# Patient Record
Sex: Male | Born: 1955
Health system: Southern US, Community
[De-identification: ages and names within clinical notes are randomized; demographics above are authoritative.]

## PROBLEM LIST (undated history)

## (undated) ENCOUNTER — Inpatient Hospital Stay: Admission: AD | Payer: Self-pay | Source: Other Acute Inpatient Hospital | Admitting: Interventional Cardiology

## (undated) DIAGNOSIS — C801 Malignant (primary) neoplasm, unspecified: Secondary | ICD-10-CM

## (undated) DIAGNOSIS — I10 Essential (primary) hypertension: Secondary | ICD-10-CM

## (undated) DIAGNOSIS — J45909 Unspecified asthma, uncomplicated: Secondary | ICD-10-CM

## (undated) DIAGNOSIS — J449 Chronic obstructive pulmonary disease, unspecified: Secondary | ICD-10-CM

## (undated) DIAGNOSIS — I219 Acute myocardial infarction, unspecified: Secondary | ICD-10-CM

## (undated) DIAGNOSIS — J302 Other seasonal allergic rhinitis: Secondary | ICD-10-CM

## (undated) DIAGNOSIS — E782 Mixed hyperlipidemia: Secondary | ICD-10-CM

## (undated) DIAGNOSIS — I2510 Atherosclerotic heart disease of native coronary artery without angina pectoris: Secondary | ICD-10-CM

## (undated) DIAGNOSIS — I480 Paroxysmal atrial fibrillation: Secondary | ICD-10-CM

## (undated) DIAGNOSIS — C4490 Unspecified malignant neoplasm of skin, unspecified: Secondary | ICD-10-CM

## (undated) HISTORY — PX: CORONARY ANGIOPLASTY: SHX604

## (undated) HISTORY — DX: Chronic obstructive pulmonary disease, unspecified: J44.9

## (undated) HISTORY — DX: Unspecified malignant neoplasm of skin, unspecified: C44.90

## (undated) HISTORY — DX: Atherosclerotic heart disease of native coronary artery without angina pectoris: I25.10

## (undated) HISTORY — DX: Mixed hyperlipidemia: E78.2

## (undated) HISTORY — DX: Acute myocardial infarction, unspecified: I21.9

## (undated) HISTORY — DX: Paroxysmal atrial fibrillation: I48.0

## (undated) HISTORY — PX: OTHER SURGICAL HISTORY: SHX169

## (undated) HISTORY — DX: Other seasonal allergic rhinitis: J30.2

---

## 2004-03-31 ENCOUNTER — Ambulatory Visit: Payer: Self-pay | Admitting: Cardiology

## 2004-04-03 ENCOUNTER — Ambulatory Visit: Payer: Self-pay | Admitting: Cardiology

## 2004-04-09 ENCOUNTER — Inpatient Hospital Stay (HOSPITAL_BASED_OUTPATIENT_CLINIC_OR_DEPARTMENT_OTHER): Admission: RE | Admit: 2004-04-09 | Discharge: 2004-04-09 | Payer: Self-pay | Admitting: Cardiology

## 2004-04-10 ENCOUNTER — Ambulatory Visit: Payer: Self-pay | Admitting: Cardiology

## 2004-04-16 ENCOUNTER — Observation Stay (HOSPITAL_COMMUNITY): Admission: RE | Admit: 2004-04-16 | Discharge: 2004-04-17 | Payer: Self-pay | Admitting: Cardiology

## 2004-04-16 ENCOUNTER — Ambulatory Visit: Payer: Self-pay | Admitting: Cardiology

## 2004-05-19 ENCOUNTER — Ambulatory Visit: Payer: Self-pay | Admitting: Cardiology

## 2004-07-14 ENCOUNTER — Ambulatory Visit: Payer: Self-pay | Admitting: Cardiology

## 2010-06-20 ENCOUNTER — Inpatient Hospital Stay (HOSPITAL_COMMUNITY)
Admission: EM | Admit: 2010-06-20 | Discharge: 2010-06-23 | DRG: 247 | Disposition: A | Discharge status: Home / Self Care | Payer: 59 | Source: Other Acute Inpatient Hospital | Attending: Cardiology | Admitting: Cardiology

## 2010-06-20 DIAGNOSIS — F17200 Nicotine dependence, unspecified, uncomplicated: Secondary | ICD-10-CM | POA: Diagnosis present

## 2010-06-20 DIAGNOSIS — J309 Allergic rhinitis, unspecified: Secondary | ICD-10-CM | POA: Diagnosis present

## 2010-06-20 DIAGNOSIS — I2589 Other forms of chronic ischemic heart disease: Secondary | ICD-10-CM | POA: Diagnosis present

## 2010-06-20 DIAGNOSIS — I2510 Atherosclerotic heart disease of native coronary artery without angina pectoris: Secondary | ICD-10-CM | POA: Diagnosis present

## 2010-06-20 DIAGNOSIS — Y840 Cardiac catheterization as the cause of abnormal reaction of the patient, or of later complication, without mention of misadventure at the time of the procedure: Secondary | ICD-10-CM | POA: Diagnosis present

## 2010-06-20 DIAGNOSIS — E785 Hyperlipidemia, unspecified: Secondary | ICD-10-CM | POA: Diagnosis present

## 2010-06-20 DIAGNOSIS — Z7982 Long term (current) use of aspirin: Secondary | ICD-10-CM

## 2010-06-20 DIAGNOSIS — Z7902 Long term (current) use of antithrombotics/antiplatelets: Secondary | ICD-10-CM

## 2010-06-20 DIAGNOSIS — Y92009 Unspecified place in unspecified non-institutional (private) residence as the place of occurrence of the external cause: Secondary | ICD-10-CM

## 2010-06-20 DIAGNOSIS — T82897A Other specified complication of cardiac prosthetic devices, implants and grafts, initial encounter: Secondary | ICD-10-CM | POA: Diagnosis present

## 2010-06-20 DIAGNOSIS — I2582 Chronic total occlusion of coronary artery: Secondary | ICD-10-CM | POA: Diagnosis present

## 2010-06-20 DIAGNOSIS — I214 Non-ST elevation (NSTEMI) myocardial infarction: Principal | ICD-10-CM | POA: Diagnosis present

## 2010-06-20 DIAGNOSIS — Z9861 Coronary angioplasty status: Secondary | ICD-10-CM

## 2010-06-20 LAB — BRAIN NATRIURETIC PEPTIDE: Pro B Natriuretic peptide (BNP): 73 pg/mL (ref 0.0–100.0)

## 2010-06-20 LAB — COMPREHENSIVE METABOLIC PANEL
ALT: 27 U/L (ref 0–53)
AST: 63 U/L — ABNORMAL HIGH (ref 0–37)
Albumin: 3.7 g/dL (ref 3.5–5.2)
CO2: 26 mEq/L (ref 19–32)
Chloride: 108 mEq/L (ref 96–112)
GFR calc Af Amer: >60 mL/min (ref >60)
GFR calc non Af Amer: >60 mL/min (ref >60)
Potassium: 3.8 mEq/L (ref 3.5–5.1)
Sodium: 140 mEq/L (ref 135–145)
Total Bilirubin: 0.6 mg/dL (ref 0.3–1.2)

## 2010-06-20 LAB — CBC
HCT: 44.8 % (ref 39.0–52.0)
Hemoglobin: 15.8 g/dL (ref 13.0–17.0)
MCV: 92.9 fL (ref 78.0–100.0)
RBC: 4.82 MIL/uL (ref 4.22–5.81)
RDW: 14 % (ref 11.5–15.5)
WBC: 19.5 10*3/uL — ABNORMAL HIGH (ref 4.0–10.5)

## 2010-06-20 LAB — DIFFERENTIAL
Basophils Absolute: 0 10*3/uL (ref 0.0–0.1)
Eosinophils Relative: 0 % (ref 0–5)
Lymphocytes Relative: 18 % (ref 12–46)
Lymphs Abs: 3.5 10*3/uL (ref 0.7–4.0)
Neutro Abs: 14.8 10*3/uL — ABNORMAL HIGH (ref 1.7–7.7)

## 2010-06-21 DIAGNOSIS — I214 Non-ST elevation (NSTEMI) myocardial infarction: Secondary | ICD-10-CM

## 2010-06-21 LAB — CARDIAC PANEL(CRET KIN+CKTOT+MB+TROPI)
CK, MB: 125.6 ng/mL (ref 0.3–4.0)
CK, MB: 105.4 ng/mL (ref 0.3–4.0)
Relative Index: 15.8 — ABNORMAL HIGH (ref 0.0–2.5)
Total CK: 794 U/L — ABNORMAL HIGH (ref 7–232)
Total CK: 780 U/L — ABNORMAL HIGH (ref 7–232)
Troponin I: 8.07 ng/mL (ref 0.00–0.06)

## 2010-06-21 LAB — CBC
HCT: 40.2 % (ref 39.0–52.0)
Hemoglobin: 13.8 g/dL (ref 13.0–17.0)
MCH: 32 pg (ref 26.0–34.0)
MCHC: 34.3 g/dL (ref 30.0–36.0)
RBC: 4.31 MIL/uL (ref 4.22–5.81)

## 2010-06-21 LAB — GLUCOSE, CAPILLARY
Glucose-Capillary: 114 mg/dL — ABNORMAL HIGH (ref 70–99)
Glucose-Capillary: 86 mg/dL (ref 70–99)

## 2010-06-21 LAB — HEPARIN LEVEL (UNFRACTIONATED): Heparin Unfractionated: 0.38 IU/mL (ref 0.30–0.70)

## 2010-06-22 DIAGNOSIS — I2510 Atherosclerotic heart disease of native coronary artery without angina pectoris: Secondary | ICD-10-CM

## 2010-06-22 LAB — GLUCOSE, CAPILLARY: Glucose-Capillary: 124 mg/dL — ABNORMAL HIGH (ref 70–99)

## 2010-06-22 LAB — POCT ACTIVATED CLOTTING TIME
Activated Clotting Time: 187 seconds
Activated Clotting Time: 523 seconds

## 2010-06-22 LAB — HEPARIN LEVEL (UNFRACTIONATED): Heparin Unfractionated: 0.36 IU/mL (ref 0.30–0.70)

## 2010-06-22 LAB — CBC
Hemoglobin: 14 g/dL (ref 13.0–17.0)
MCH: 31.8 pg (ref 26.0–34.0)
Platelets: 293 10*3/uL (ref 150–400)
RBC: 4.4 MIL/uL (ref 4.22–5.81)
WBC: 17.4 10*3/uL — ABNORMAL HIGH (ref 4.0–10.5)

## 2010-06-23 LAB — BASIC METABOLIC PANEL
BUN: 8 mg/dL (ref 6–23)
CO2: 28 mEq/L (ref 19–32)
Calcium: 8.2 mg/dL — ABNORMAL LOW (ref 8.4–10.5)
Creatinine, Ser: 0.75 mg/dL (ref 0.4–1.5)
GFR calc non Af Amer: >60 mL/min (ref >60)
Glucose, Bld: 102 mg/dL — ABNORMAL HIGH (ref 70–99)

## 2010-06-23 LAB — CBC
HCT: 39.7 % (ref 39.0–52.0)
Hemoglobin: 13.1 g/dL (ref 13.0–17.0)
MCH: 31 pg (ref 26.0–34.0)
MCHC: 33 g/dL (ref 30.0–36.0)
MCV: 94.1 fL (ref 78.0–100.0)
RDW: 14.2 % (ref 11.5–15.5)

## 2010-06-25 NOTE — Procedures (Signed)
[name], [name]              ACCOUNT NO.:  [id number]  MEDICAL RECORD NO.:  [id number]           PATIENT TYPE:  O  LOCATION:  6523                         FACILITY:  [facility]  PHYSICIAN:  Arturo Morton. Riley Kill, MD, FACCDATE OF BIRTH:  Aug 08, 1955  DATE OF PROCEDURE:  06/22/2010 DATE OF DISCHARGE:                           CARDIAC CATHETERIZATION   INDICATIONS:  [name] is a 55 year old who presents with chest pain. He has had prior stenting of the right coronary artery.  He has continued to smoke.  His enzymes were positive.  The current study was done to assess coronary anatomy.  Risks, benefits and alternatives were discussed with the patient.  He consented to proceed.  PROCEDURES: 1. Left heart catheterization. 2. Selective coronary arteriography. 3. Selective left ventriculography. 4. Stenting of the circumflex coronary artery using a drug-eluting     platform 2.75 x 16 Promus Element postdilated to 3.25.  DESCRIPTION OF PROCEDURE:  The patient was brought to the Catheterization Laboratory and prepped and draped in the usual fashion. We used the right radial approach.  The right radial artery was easily entered, a 5-French sheath was placed.  Diagnostic views of the right and left coronary arteries were obtained.  Central aortic and left ventricular pressures were obtained with a pigtail catheter. Ventriculography was performed in the RAO projection.  I carefully studied the old images and compared to the new.  The findings included a modestly plaqued left anterior descending artery without high-grade occlusion.  This collateralized the PDA.  The stent site was concluded on the right.  The circumflex has what appears to be a ruptured lesion in the mid-to-distal vessel.  There was modest plaque in the first marginal.  After discussion and consideration of the presentation, we elected to recommend stenting of the circumflex coronary artery.  I discussed this with the  patient and then informed his family.  The 5- French sheath in the radial artery was then upgraded to a 6-French sheath, a JL-3.5 guiding catheter was utilized.  We were able to take a traverse wire down across the lesion.  The vessel was directly stented using a 16 x 2.75 Promus Element drug-eluting stent, which was taken to 14 atmospheres.  Postdilatation was then done with a noncompliant 3.25- mm balloon.  This was taken up to at least 14 atmospheres.  There was marked improvement in the appearance of the artery.  Intracoronary nitroglycerin was administered.  The patient did have some catheter- induced spasm in the proximal LAD during the diagnostic shots, and we did a nonselective shot given the fact that the patient had separate ostia.  Using the guiding catheter, and the LAD appeared to be widely patent.  There were no major complications.  All catheters were subsequently removed and the TR band was placed.  For anticoagulation, heparin was utilized beginning of the radial case along with 3 mg of intra-arterial verapamil.  Heparin was given at 50 units per kg and ACT was then subsequently checked.  Bivalirudin was given according to protocol.  ACT was appropriate for percutaneous intervention.  I spoke with family after completion of the procedure.  HEMODYNAMIC  DATA: 1. Central aortic pressure 106/73, mean 88. 2. LV pressure 109/25. 3. There was no gradient or pullback across aortic valve.  ANGIOGRAPHIC DATA: 1. Ventriculography was done in the RAO projection, appears to be     inferobasal hypokinesis with EF of approximately 45%. 2. There are separate ostia for the LAD and circumflex. 3. The LAD has 30-40% segmental plaque at the proximal ostium, but it     does not appear to be high grade.  There are two major diagonal     branches.  The LAD proper courses to the apex were bifurcates.     There is diffuse plaquing at the apical tip.  There are extensive     collaterals to  the distal posterior descending branch, which fills     retrograde.  There was some catheter-induced spasm at the beginning     of the case, which was relieved with nitroglycerin as documented by     subselective shot on the circumflex vessel. 4. The left circumflex has a separate ostia.  There was a first     marginal branch with segmental ostial disease of 40-50%.  There is     a tiny second marginal, then a major second marginal or third     marginal formally which has a ruptured plaque in its midportion     that is hazy.  It is about 80% and that is at the site of the     previous area of disease.  Following stenting, this was reduced to     0% with residual luminal narrowing and excellent angiographic     result.  The remainder of the circumflex is without critical     disease. 5. The right coronary artery is patent up into the stent site, which     totally occluded.  As noted that the PDA fills retrograde from the     LAD system.  CONCLUSIONS: 1. Mild reduction in left ventricular function with an inferior wall     motion abnormality. 2. Probable chronic total occlusion of the right coronary artery with     excellent collaterals to the distal posterior descending artery     from the left anterior descending vessel. 3. New ruptured plaque in the midportion of the circumflex coronary     artery with successful stenting using a drug-eluting platform.  DISPOSITION:  The patient will be treated with continued aspirin and Plavix.  He received a Plavix load earlier today.  We will get P2Y12 inhibition testing.  Discontinuation of smoking will be highly recommended and a smoking cessation consult obtained.  Follow up will be with Dr. Diona Browner in [place].     Arturo Morton. Riley Kill, MD, [facility]     TDS/MEDQ  D:  06/22/2010  T:  06/23/2010  Job:  [id number]  cc:   CV Laboratory Edward Sidle, MD [name]. Edward Squibb, MD, [facility]  Electronically Signed by Shawnie Pons MD [facility] on 06/25/2010  05:42:01 AM

## 2010-06-26 ENCOUNTER — Telehealth: Payer: Self-pay | Admitting: Cardiology

## 2010-06-26 ENCOUNTER — Encounter: Payer: Self-pay | Admitting: *Deleted

## 2010-06-26 NOTE — Telephone Encounter (Signed)
This pt is actually followed by Dr Diona Browner for primary Cardiology.  Dr Riley Kill only performed cardiac cath.  The pt needs to get note from Dr Diona Browner in the [place] office.  I will route this message to that office for follow-up with the patient.

## 2010-06-26 NOTE — Telephone Encounter (Signed)
Wife called requesting return to work note for Monday since he was informed by Dr. Riley Kill he could return on Monday after stenting placed. Wife stated they forgot to ask Dr. Riley Kill for not after cath. Patient is scheduled ror f/u with [facility] on May 3rd. Please advise.

## 2010-06-26 NOTE — Telephone Encounter (Signed)
Pt wife states pt needs a note to return to work fax to #[phone]. Dr Riley Kill did his cath on Monday.

## 2010-06-26 NOTE — Telephone Encounter (Signed)
Actually it does not appear that I have seen this gentleman since 2006. Recent discharge summary was reviewed indicating non-ST elevation myocardial infarction and drug-eluting stent placement to the circumflex. There is no mention of when he is to return to work. Please determine what type of work he performs, to see if it i reasonable for him to make a return on Monday.[name]

## 2010-06-26 NOTE — Telephone Encounter (Signed)
Would agree with Dr. Riley Kill since recent infarct. I can see him at some point over the next few weeks to determine if he is ready to go back to work. I know that my schedule is full. Perhaps he could be seen by Gene on a day that I am in the office and can look at him as well.

## 2010-06-26 NOTE — Telephone Encounter (Signed)
Please advise on response from [facility] office.

## 2010-06-26 NOTE — Telephone Encounter (Signed)
I spoke with Dr Riley Kill and he said that this pt cannot return to work for 2 weeks following MI.  He felt that the pt should be evaluated by Dr Diona Browner in the office prior to May 3rd to discuss return to work.  I will forward this message back to the [place] office to follow-up with patient. 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

## 2010-06-29 NOTE — Telephone Encounter (Signed)
Patient's wife informed of the above.

## 2010-06-30 NOTE — Discharge Summary (Signed)
[name], [name]              ACCOUNT NO.:  [id number]  MEDICAL RECORD NO.:  [id number]           PATIENT TYPE:  O  LOCATION:  6523                         FACILITY:  [facility]  PHYSICIAN:  Madolyn Frieze. Jens Som, MD, FACCDATE OF BIRTH:  Mar 22, 1955  DATE OF ADMISSION:  06/20/2010 DATE OF DISCHARGE:  06/23/2010                              DISCHARGE SUMMARY   PRIMARY CARDIOLOGIST:  Jonelle Sidle, MD  DISCHARGE DIAGNOSIS:  Non-ST-segment elevation myocardial infarction.  SECONDARY DIAGNOSES: 1. Coronary artery disease status post prior right coronary artery     stenting in 2006 with left circumflex stenting this admission. 2. Hyperlipidemia. 3. Seasonal allergies. 4. History of nasal reconstruction surgery. 5. Ischemic cardiomyopathy with an ejection fraction of 45%. 6. Ongoing tobacco abuse.  ALLERGIES:  No known drug allergies.  PROCEDURES: 1. Left heart cardiac catheterization performed on June 22, 2010,     revealing total occlusion of previously placed right coronary     artery stents with left-to-right collaterals.  There was also     ruptured plaque in the left circumflex with 80% stenosis and this     was successfully stented with placement of a 2.75 x 16-mm Promus     Element Plus drug-eluting stent.  Left ventricular ejection     fraction was 45% with inferior hypokinesis.  HISTORY OF PRESENT ILLNESS:  This is a 55 year old male with prior history of coronary disease status post right coronary artery drug- eluting stent placement in 2006 who has continued to smoke cigarettes. He was in his usual state of health until June 19, 2010, when he noted right shoulder pain and general restlessness and inability to sleep that night.  On the day of admission, the patient had sudden onset of diaphoresis with nausea and lightheadedness while mowing his lawn.  He checked his blood pressure at home and found it to be 71/49.  At that point, he decided to present to the [facility] ED where he was treated with aspirin, heparin, and sublingual nitroglycerin with significant improvement in his shoulder discomfort.  ECG showed inferior Qs with 0.5- mm ST-segment elevation but no reciprocal ECG changes.  Cardiac enzymes showed a CK of 458 with an MB of 80.6, and troponin I of 2.4.  The patient was admitted for further evaluation and management of non-ST- segment elevation myocardial infarction.  HOSPITAL COURSE:  The patient eventually peaked a CK at 794, MB at 125.6, and troponin I at 8.07.  The patient was maintained on aspirin, beta-blocker, high-dose statin, and heparin therapy and subsequently loaded with oral Plavix.  The patient had no recurrence of chest discomfort over the weekend and on Monday, June 22, 2010, the patient was taken to the cardiac cath lab where diagnostic catheterization revealed total occlusion of previously placed right coronary artery with stent with left-to-right collaterals.  There was a new ruptured plaque in the mid left circumflex with 80% stenosis and this was felt to be the culprit lesion.  This was subsequently stented with placement of 2.75 x 16 mm Promus Element Plus drug-eluting stent.  The patient, otherwise, had nonobstructive disease in his proximal LAD  and proximal OM-1.  EF was 45% with inferior hypokinesis.  Postprocedure, the patient has been ambulating without recurrent symptoms or limitations.  He has been counseled on the importance of smoking cessation and has also been seen by Cardiac Rehab with outpatient referral made.  Of note, in the setting of ischemic cardiomyopathy, the patient has tolerated beta-blocker therapy; however, we have been unable to add ACE inhibitor or ARB secondary to borderline blood pressures in the mid 90s.  The patient will be discharged home today in good condition.  DISCHARGE LABORATORY DATA:  Hemoglobin 13.1, hematocrit 39.7, WBC 13.1, platelets 271, P2Y12 of 86 with 65% inhibition.   Sodium 141, potassium 3.6, chloride 107, CO2 of 28, BUN 8, creatinine 0.75, glucose 102, total bilirubin 0.6, alkaline phosphatase 61, AST 63, ALT 27, total protein 6.7, albumin 3.7, calcium 8.2.  CK 780, MB 105.4, troponin I 7.77.  MRSA screen was negative.  DISPOSITION:  The patient will be discharged home today in good condition.  FOLLOWUP PLANS AND APPOINTMENTS:  The patient is to follow up with Dr. Diona Browner as scheduled on Jul 16, 2010, at 9:20 a.m.  DISCHARGE MEDICATIONS: 1. Plavix 75 mg daily. 2. Lipitor 80 mg nightly. 3. Metoprolol 25 mg half a tablet b.i.d. 4. Nitroglycerin 0.4 mg sublingual p.r.n. chest pain. 5. Aspirin 81 mg daily. 6. Claritin 10 mg daily. 7. Fish oil over the counter 4 g daily. 8. Multivitamin 1 tablet daily.  OUTSTANDING LABORATORY STUDIES:  Followup lipids and LFTs in 6-8 weeks given new statin therapy.  DURATION OF DISCHARGE ENCOUNTER:  40 minutes including physician time.     Nicolasa Ducking, ANP   ______________________________ Madolyn Frieze. Jens Som, MD, [facility]    CB/MEDQ  D:  06/23/2010  T:  06/24/2010  Job:  [id number]  Electronically Signed by Nicolasa Ducking ANP on 06/29/2010 03:04:06 PM Electronically Signed by Olga Millers MD FACC on 06/30/2010 05:22:15 AM

## 2010-07-07 ENCOUNTER — Encounter: Payer: Self-pay | Admitting: *Deleted

## 2010-07-07 ENCOUNTER — Encounter: Payer: Self-pay | Admitting: Cardiology

## 2010-07-07 ENCOUNTER — Ambulatory Visit (INDEPENDENT_AMBULATORY_CARE_PROVIDER_SITE_OTHER): Payer: 59 | Admitting: Cardiology

## 2010-07-07 VITALS — BP 117/78 | HR 70 | Ht 71.0 in | Wt 221.0 lb

## 2010-07-07 DIAGNOSIS — I214 Non-ST elevation (NSTEMI) myocardial infarction: Secondary | ICD-10-CM

## 2010-07-07 DIAGNOSIS — I10 Essential (primary) hypertension: Secondary | ICD-10-CM

## 2010-07-07 DIAGNOSIS — E669 Obesity, unspecified: Secondary | ICD-10-CM

## 2010-07-07 DIAGNOSIS — I25119 Atherosclerotic heart disease of native coronary artery with unspecified angina pectoris: Secondary | ICD-10-CM | POA: Insufficient documentation

## 2010-07-07 DIAGNOSIS — I2510 Atherosclerotic heart disease of native coronary artery without angina pectoris: Secondary | ICD-10-CM

## 2010-07-07 DIAGNOSIS — Z79899 Other long term (current) drug therapy: Secondary | ICD-10-CM

## 2010-07-07 DIAGNOSIS — E782 Mixed hyperlipidemia: Secondary | ICD-10-CM

## 2010-07-07 DIAGNOSIS — F17200 Nicotine dependence, unspecified, uncomplicated: Secondary | ICD-10-CM

## 2010-07-07 NOTE — Assessment & Plan Note (Signed)
He is working on smoking cessation. Using nicotine gum. States that he did not have success with nicotine patches or Wellbutrin in the past.

## 2010-07-07 NOTE — Patient Instructions (Signed)
   Follow up in 2 months as scheduled. Your physician recommends that you continue on your current medications as directed. Please refer to the Current Medication list given to you today. You may return to work. Your physician recommends that you go to the [facility] for a FASTING lipid profile and liver function labs. Do not eat or drink after midnight. DO IN 2 MONTHS BEFORE OFFICE VISIT. TAKE THE LAB ORDER WITH YOU TO THE [facility].

## 2010-07-07 NOTE — Assessment & Plan Note (Signed)
Blood pressure well-controlled today. 

## 2010-07-07 NOTE — Assessment & Plan Note (Signed)
On high-dose Lipitor. Plan followup fasting lipid profile and liver function tests for his next visit.

## 2010-07-07 NOTE — Assessment & Plan Note (Signed)
We discussed diet and exercise for weight loss.

## 2010-07-07 NOTE — Progress Notes (Signed)
Clinical Summary [name] is a 55 y.o.male presenting to the office for a post hospital followup. I have not seen him since 2006. He recently presented with a non-ST elevation myocardial infarction and was transferred to [facility], undergoing cardiac catheterization on April 10 with Dr. Riley Kill. Results are outlined below. The patient underwent drug-eluting stent placement to the circumflex, otherwise managed medically.  Lab work review finds peak troponin I 8.0, BUN 8, creatinine 0.7, potassium 3.6, hemoglobin 13.1, platelets 271. No lipids obtained.  He is here with his wife today for followup. He reports doing relatively well. He is trying to make major changes including smoking cessation, diet, and also weight loss. He has not yet returned to work in his [place] to do so.  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  Reviewed his medications. He reports compliance, no bleeding problems on dual anti-platelet therapy. We also discussed the basic walking regimen. He states that his schedule will not allow cardiac rehabilitation.   No Known Allergies  Current outpatient prescriptions:aspirin 81 MG tablet, Take 81 mg by mouth daily.  , Disp: , Rfl: ;  atorvastatin (LIPITOR) 80 MG tablet, Take 80 mg by mouth daily.  , Disp: , Rfl: ;  clopidogrel (PLAVIX) 75 MG tablet, Take 75 mg by mouth daily.  , Disp: , Rfl: ;  loratadine (CLARITIN) 10 MG tablet, Take 10 mg by mouth daily.  , Disp: , Rfl: ;  metoprolol tartrate (LOPRESSOR) 25 MG tablet, Take 12.5 mg by mouth 2 (two) times daily.  , Disp: , Rfl:  Multiple Vitamin (MULTIVITAMIN) tablet, Take 1 tablet by mouth daily.  , Disp: , Rfl: ;  nitroGLYCERIN (NITROSTAT) 0.4 MG SL tablet, Place 0.4 mg under the tongue every 5 (five) minutes as needed.  , Disp: , Rfl: ;  Omega-3 Fatty Acids (FISH OIL) 1000 MG CAPS, Take 2 capsules by mouth 2 (two)  times daily.  , Disp: , Rfl:   Past Medical History  Diagnosis Date  . Coronary atherosclerosis of native coronary artery     DES RCA 2006, DES circumflex 4/12, occluded RCA with collaterals, LVEF 45%  . Myocardial infarction     NSTEMI 4/12  . Mixed hyperlipidemia   . Seasonal allergies     Social History [name] reports that he quit smoking 12 days ago. His smoking use included Cigarettes. He has a 45 pack-year smoking history. He has never used smokeless tobacco. [name] reports that he does not drink alcohol.  Review of Systems Otherwise reviewed and negative except as outlined.  Physical Examination Filed Vitals:   07/07/10 1506  BP: 117/78  Pulse: 70  Obese male in no acute distress. HEENT: Conjunctiva and lids normal, oropharynx clear. Neck: Supple, no elevated JVP or bruits, no thyromegaly. Lungs: Clear to auscultation, nontender. Cardiac: Regular rate and rhythm, no S3 or pericardial rub. Abdomen: Obese, nontender, bowel sounds present. Skin: Warm and dry, resolving ecchymosis in the right forearm. Extremities: No pitting edema, distal pulses full. Musculoskeletal: No kyphosis. Neuropsychiatric: Alert and oriented x3, affect appropriate.   Studies Cardiac catheterization 06/23/2010: ANGIOGRAPHIC DATA:   1. Ventriculography was done in the RAO projection, appears to be       inferobasal hypokinesis with EF of approximately 45%.   2. There are separate ostia for the LAD and circumflex.   3. The LAD has 30-40%  segmental plaque at the proximal ostium, but it       does not appear to be high grade.  There are two major diagonal       branches.  The LAD proper courses to the apex were bifurcates.       There is diffuse plaquing at the apical tip.  There are extensive       collaterals to the distal posterior descending branch, which fills       retrograde.  There was some catheter-induced spasm at the beginning       of the case, which was relieved with  nitroglycerin as documented by       subselective shot on the circumflex vessel.   4. The left circumflex has a separate ostia.  There was a first       marginal branch with segmental ostial disease of 40-50%.  There is       a tiny second marginal, then a major second marginal or third       marginal formally which has a ruptured plaque in its midportion       that is hazy.  It is about 80% and that is at the site of the       previous area of disease.  Following stenting, this was reduced to       0% with residual luminal narrowing and excellent angiographic       result.  The remainder of the circumflex is without critical       disease.   5. The right coronary artery is patent up into the stent site, which       totally occluded.  As noted that the PDA fills retrograde from the       LAD system.  Problem List and Plan

## 2010-07-07 NOTE — Assessment & Plan Note (Signed)
Clinically stable, over 2 weeks out from his initial event. Now status post drug-eluting stent placement as outlined, on dual antiplatelet therapy. Patient should be able to return to work with prior level of activity, essentially involving walking on a regular basis, no strenuous activity. He states that he will not be able to do formal cardiac rehabilitation. I did discuss the importance of diet, exercise, and weight loss, as well as his ongoing efforts at complete smoking cessation. Office followup to be arranged.

## 2010-07-07 NOTE — Assessment & Plan Note (Signed)
Anatomy outlined above. Presently stable symptomatically.

## 2010-07-16 ENCOUNTER — Encounter: Payer: 59 | Admitting: Cardiology

## 2010-07-16 NOTE — H&P (Signed)
NAMEGIANN, [name]              ACCOUNT NO.:  [id number]  MEDICAL RECORD NO.:  [id number]           PATIENT TYPE:  I  LOCATION:  2928                         FACILITY:  [facility]  PHYSICIAN:  Corky Crafts, MDDATE OF BIRTH:  October 25, 1955  DATE OF ADMISSION:  06/20/2010 DATE OF DISCHARGE:                             HISTORY & PHYSICAL   PRIMARY CARDIOLOGIST:  Jonelle Sidle, MD  REASON FOR ADMISSION:  Non-ST elevation MI.  HISTORY OF PRESENT ILLNESS:  The patient is a 55-year male with known coronary artery disease who continues to smoke.  He had drug-eluting stents placed in his right coronary artery in 2006.  He had been doing well since that time until yesterday.  He had shoulder pain, particularly in the right shoulder, he could not sleep.  This morning, he tried mowing the lawn and he broke out into a cold sweat and felt nauseated and lightheaded.  He checked his blood pressure at home and itwas 71/49.  He went to the emergency room and received nitroglycerin and aspirin along with heparin.  His shoulder tightness significantly decreased.  Currently, he feels much better.  He has chronic shoulder pain and he currently feels that he is not feeling the same symptoms he had earlier today when he went to the emergency room.  PAST MEDICAL HISTORY:  Coronary artery disease.  FAMILY HISTORY:  Significant for early coronary artery disease.  ALLERGIES:  No known drug allergies.  SOCIAL HISTORY:  Nasal reconstruction surgery.  SOCIAL HISTORY:  He does smoke.  He lives in [place].  MEDICATIONS: 1. Fish oil 4 grams daily. 2. Aspirin 325 mg daily. 3. Claritin 10 mg daily. 4. Multivitamin.  He has been off Plavix for several years.  REVIEW OF SYSTEMS:  Significant for shoulder and arm pain, nausea, sweating.  No bleeding problems.  No chest pain.  No focal weakness. All other systems negative.  PHYSICAL EXAMINATION:  VITAL SIGNS:  Blood pressure 102/73, heart  rate 18. GENERAL:  He is awake, alert, and in no apparent stress. HEAD:  Normocephalic and atraumatic. EYES:  Extraocular movements intact. NECK:  No JVD. CARDIOVASCULAR:  Regular rate and rhythm.  S1-S2. LUNGS:  Clear to auscultation bilaterally. ABDOMEN:  Soft, nontender, and nondistended. EXTREMITIES:  No edema, 2+ right radial pulse, 2+ pedal pulse bilaterally.  LABORATORY DATA:  At [facility] showed a CK of 458, MB of 80.6, troponin 2.4, and creatinine 1.02.  ECG showed normal sinus rhythm.  There were inferior Q-waves and some QRS complexes in the inferior leads.  There was 0.5 mm ST elevation but no reciprocal ECG changes were noted.  ASSESSMENT AND PLAN: 59. A 55 year old with non-ST-elevation myocardial infarction symptoms,     currently much improved, question whether this an issue with his     prior Cypher stent to the right coronary artery or perhaps a new     problem in his circumflex given the lack of ECG changes.  We will     have him on heparin IV, start low-dose beta blocker, and continue     aspirin. 2. He needs to stop smoking. 3. Given his  known coronary artery disease, we will start Crestor 10     mg daily. 4. He is agreeable to heart catheterization.  If his symptoms get     worse or his enzymes are very high, we would consider performing     the catheterization before Monday.     Corky Crafts, MD     JSV/MEDQ  D:  06/20/2010  T:  06/21/2010  Job:  [id number]  Electronically Signed by Lance Muss MD on 07/16/2010 01:30:59 PM

## 2010-09-08 ENCOUNTER — Ambulatory Visit (INDEPENDENT_AMBULATORY_CARE_PROVIDER_SITE_OTHER): Payer: 59 | Admitting: Cardiology

## 2010-09-08 ENCOUNTER — Encounter: Payer: Self-pay | Admitting: Cardiology

## 2010-09-08 VITALS — BP 114/71 | HR 63 | Ht 70.0 in | Wt 225.1 lb

## 2010-09-08 DIAGNOSIS — I10 Essential (primary) hypertension: Secondary | ICD-10-CM

## 2010-09-08 DIAGNOSIS — I2510 Atherosclerotic heart disease of native coronary artery without angina pectoris: Secondary | ICD-10-CM

## 2010-09-08 DIAGNOSIS — E782 Mixed hyperlipidemia: Secondary | ICD-10-CM

## 2010-09-08 DIAGNOSIS — F17200 Nicotine dependence, unspecified, uncomplicated: Secondary | ICD-10-CM

## 2010-09-08 NOTE — Assessment & Plan Note (Signed)
Blood pressure well-controlled today. 

## 2010-09-08 NOTE — Assessment & Plan Note (Signed)
Symptomatically stable. Continue medical therapy and observation. 

## 2010-09-08 NOTE — Progress Notes (Signed)
Clinical Summary Mr. Memmer is a 55 y.o.male presenting for followup. He was seen in the office back in April following a non-ST elevation myocardial infarction.  Followup lab work from June 5 showed AST 24, ALT 38, triglycerides 54, cholesterol 102, HDL 39, LDL 52. We discussed this today. He reports compliance with his medications.  He is back to work, no reported problems or limitations. Does some walking for exercise. States that he continues complete smoking cessation, has also been watching his diet. No unusual bleeding problems.  States that he has not seen Dr. Dimas Aguas for a routine physical in quite some time, and was encouraged to do so.  No Known Allergies  Current outpatient prescriptions:aspirin 81 MG tablet, Take 81 mg by mouth daily.  , Disp: , Rfl: ;  atorvastatin (LIPITOR) 80 MG tablet, Take 80 mg by mouth daily.  , Disp: , Rfl: ;  clopidogrel (PLAVIX) 75 MG tablet, Take 75 mg by mouth daily.  , Disp: , Rfl: ;  loratadine (CLARITIN) 10 MG tablet, Take 10 mg by mouth daily.  , Disp: , Rfl: ;  metoprolol tartrate (LOPRESSOR) 25 MG tablet, Take 12.5 mg by mouth 2 (two) times daily.  , Disp: , Rfl:  Multiple Vitamin (MULTIVITAMIN) tablet, Take 1 tablet by mouth daily.  , Disp: , Rfl: ;  Omega-3 Fatty Acids (FISH OIL) 1000 MG CAPS, Take 2 capsules by mouth 2 (two) times daily.  , Disp: , Rfl: ;  nitroGLYCERIN (NITROSTAT) 0.4 MG SL tablet, Place 0.4 mg under the tongue every 5 (five) minutes as needed.  , Disp: , Rfl:   Past Medical History  Diagnosis Date  . Coronary atherosclerosis of native coronary artery     DES RCA 2006, DES circumflex 4/12, occluded RCA with collaterals, LVEF 45%  . Myocardial infarction     NSTEMI 4/12  . Mixed hyperlipidemia   . Seasonal allergies     Social History Mr. Iannello reports that he quit smoking about 2 months ago. His smoking use included Cigarettes. He has a 45 pack-year smoking history. He has never used smokeless tobacco. Mr. Gladu reports  that he does not drink alcohol.  Review of Systems Negative except as outlined above.  Physical Examination Filed Vitals:   09/08/10 0930  BP: 114/71  Pulse: 63   Obese male in no acute distress.  HEENT: Conjunctiva and lids normal, oropharynx clear.  Neck: Supple, no elevated JVP or bruits, no thyromegaly.  Lungs: Clear to auscultation, nontender.  Cardiac: Regular rate and rhythm, no S3 or pericardial rub.  Abdomen: Obese, nontender, bowel sounds present.  Skin: Warm and dry, resolving ecchymosis in the right forearm.  Extremities: No pitting edema, distal pulses full.  Musculoskeletal: No kyphosis.  Neuropsychiatric: Alert and oriented x3, affect appropriate.    Problem List and Plan

## 2010-09-08 NOTE — Assessment & Plan Note (Signed)
Patient continues complete smoking cessation. He is using nicotine gum.

## 2010-09-08 NOTE — Patient Instructions (Signed)
Your physician wants you to follow-up in: 6 months. You will receive a reminder letter in the mail one-two months in advance. If you don't receive a letter, please call our office to schedule the follow-up appointment. Your physician recommends that you continue on your current medications as directed. Please refer to the Current Medication list given to you today. Your physician recommends that you go to the [facility] for a FASTING lipid profile and liver function labs. Do not eat or drink after midnight. DO IN 6 MONTHS BEFORE NEXT OFFICE VISIT. 

## 2010-09-08 NOTE — Assessment & Plan Note (Addendum)
Lipids are well controlled. Continue present regimen. Followup lipid profile and liver function tests for next visit.

## 2011-01-20 ENCOUNTER — Other Ambulatory Visit: Payer: Self-pay | Admitting: *Deleted

## 2011-01-20 ENCOUNTER — Other Ambulatory Visit: Payer: Self-pay | Admitting: Cardiovascular Disease

## 2011-01-20 DIAGNOSIS — Z79899 Other long term (current) drug therapy: Secondary | ICD-10-CM

## 2011-01-20 DIAGNOSIS — E782 Mixed hyperlipidemia: Secondary | ICD-10-CM

## 2011-03-17 ENCOUNTER — Ambulatory Visit: Payer: 59 | Admitting: Cardiology

## 2011-04-09 ENCOUNTER — Ambulatory Visit: Payer: 59 | Admitting: Cardiology

## 2011-08-20 ENCOUNTER — Other Ambulatory Visit: Payer: Self-pay | Admitting: Cardiovascular Disease

## 2011-10-06 ENCOUNTER — Ambulatory Visit: Payer: 59 | Admitting: Cardiology

## 2011-11-01 ENCOUNTER — Encounter: Payer: Self-pay | Admitting: Cardiology

## 2011-11-01 ENCOUNTER — Ambulatory Visit (INDEPENDENT_AMBULATORY_CARE_PROVIDER_SITE_OTHER): Payer: 59 | Admitting: Cardiology

## 2011-11-01 VITALS — BP 135/79 | HR 70 | Ht 71.0 in | Wt 212.1 lb

## 2011-11-01 DIAGNOSIS — E782 Mixed hyperlipidemia: Secondary | ICD-10-CM

## 2011-11-01 DIAGNOSIS — E785 Hyperlipidemia, unspecified: Secondary | ICD-10-CM

## 2011-11-01 DIAGNOSIS — I10 Essential (primary) hypertension: Secondary | ICD-10-CM

## 2011-11-01 DIAGNOSIS — Z79899 Other long term (current) drug therapy: Secondary | ICD-10-CM

## 2011-11-01 DIAGNOSIS — I2510 Atherosclerotic heart disease of native coronary artery without angina pectoris: Secondary | ICD-10-CM

## 2011-11-01 NOTE — Assessment & Plan Note (Signed)
No change to current medical regimen. 

## 2011-11-01 NOTE — Assessment & Plan Note (Signed)
Symptomatically stable on medical therapy. Followup ECG reviewed. Refills were provided for Lopressor, Plavix, and Lipitor. Continue observation. Recommended interval physical with Dr. Dimas Aguas.

## 2011-11-01 NOTE — Assessment & Plan Note (Signed)
Due for followup FLP and LFT. 

## 2011-11-01 NOTE — Progress Notes (Signed)
   Clinical Summary [name] is a 56 y.o.male presenting for followup. He was seen in June of 2012. He is here with his wife. Reports no significant angina or unusual shortness of breath. States that he has been compliant with his medications. He has not had any followup lab work. ECG shows sinus rhythm with old inferior infarct pattern. He needs refills on his cardiac medications.   No Known Allergies  Current Outpatient Prescriptions  Medication Sig Dispense Refill  . aspirin 81 MG tablet Take 81 mg by mouth daily.        [name] atorvastatin (LIPITOR) 80 MG tablet take 1 tablet by mouth at bedtime  30 tablet  6  . clopidogrel (PLAVIX) 75 MG tablet take 1 tablet by mouth once daily with food  30 tablet  6  . loratadine (CLARITIN) 10 MG tablet Take 10 mg by mouth daily.        . metoprolol tartrate (LOPRESSOR) 25 MG tablet take 1/2 tablet by mouth twice a day  30 tablet  6  . Multiple Vitamin (MULTIVITAMIN) tablet Take 1 tablet by mouth daily.        . Omega-3 Fatty Acids (FISH OIL) 1000 MG CAPS Take 2 capsules by mouth 2 (two) times daily.        . nitroGLYCERIN (NITROSTAT) 0.4 MG SL tablet Place 0.4 mg under the tongue every 5 (five) minutes as needed.          Past Medical History  Diagnosis Date  . Coronary atherosclerosis of native coronary artery     DES RCA 2006, DES circumflex 4/12, occluded RCA with collaterals, LVEF 45%  . Myocardial infarction     NSTEMI 4/12  . Mixed hyperlipidemia   . Seasonal allergies     Social History [name] reports that he has been smoking Cigarettes.  He has a 45 pack-year smoking history. He has never used smokeless tobacco. [name] reports that he does not drink alcohol.  Review of Systems No reported bleeding problems. No orthopnea or PND. No claudication.  Physical Examination Filed Vitals:   11/01/11 1041  BP: 135/79  Pulse: 70    Obese male in no acute distress.  HEENT: Conjunctiva and lids normal, oropharynx clear.  Neck:  Supple, no elevated JVP or bruits, no thyromegaly.  Lungs: Clear to auscultation, nontender.  Cardiac: Regular rate and rhythm, no S3 or pericardial rub.  Abdomen: Obese, nontender, bowel sounds present.  Skin: Warm and dry, resolving ecchymosis in the right forearm.  Extremities: No pitting edema, distal pulses full.  Musculoskeletal: No kyphosis.  Neuropsychiatric: Alert and oriented x3, affect appropriate.    Problem List and Plan   Coronary atherosclerosis of native coronary artery Symptomatically stable on medical therapy. Followup ECG reviewed. Refills were provided for Lopressor, Plavix, and Lipitor. Continue observation. Recommended interval physical with Dr. Dimas Aguas.  Essential hypertension, benign No change to current medical regimen.  Mixed hyperlipidemia Due for followup FLP and LFT.    Jonelle Sidle, M.D., F.A.C.C.

## 2011-11-01 NOTE — Patient Instructions (Addendum)
Your physician recommends that you schedule a follow-up appointment in: 1 year. You will receive a reminder letter in the mail in about 10 months reminding you to call and schedule your appointment. If you don't receive this letter, please contact our office.  Your physician recommends that you continue on your current medications as directed. Please refer to the Current Medication list given to you today.  Your physician recommends that you return for a FASTING lipid/liver profile: at [facility]. Please don't eat or drink after MIDNIGHT when you have this done.

## 2011-11-10 ENCOUNTER — Encounter: Payer: Self-pay | Admitting: *Deleted

## 2012-02-04 ENCOUNTER — Other Ambulatory Visit: Payer: Self-pay | Admitting: Cardiovascular Disease

## 2012-04-11 ENCOUNTER — Other Ambulatory Visit: Payer: Self-pay | Admitting: Cardiovascular Disease

## 2012-04-12 ENCOUNTER — Other Ambulatory Visit: Payer: Self-pay | Admitting: Cardiovascular Disease

## 2012-11-26 ENCOUNTER — Other Ambulatory Visit: Payer: Self-pay | Admitting: Cardiovascular Disease

## 2012-11-30 ENCOUNTER — Other Ambulatory Visit: Payer: Self-pay | Admitting: Cardiology

## 2012-12-07 ENCOUNTER — Encounter: Payer: 59 | Admitting: Cardiology

## 2012-12-07 ENCOUNTER — Encounter: Payer: Self-pay | Admitting: Cardiology

## 2012-12-07 NOTE — Progress Notes (Signed)
NNo-show. This encounter was created in error - please disregard. 

## 2013-01-31 ENCOUNTER — Encounter: Payer: Self-pay | Admitting: Cardiology

## 2013-01-31 HISTORY — PX: CYSTOSCOPY: SUR368

## 2013-04-06 ENCOUNTER — Other Ambulatory Visit: Payer: Self-pay | Admitting: Cardiology

## 2013-07-04 ENCOUNTER — Other Ambulatory Visit: Payer: Self-pay | Admitting: Cardiology

## 2013-07-04 NOTE — Telephone Encounter (Signed)
Number that is documented in pt chart is the wrong number for pt. Pt needs an appointment.

## 2013-07-12 ENCOUNTER — Other Ambulatory Visit: Payer: Self-pay | Admitting: Cardiology

## 2013-07-12 ENCOUNTER — Telehealth: Payer: Self-pay | Admitting: Cardiology

## 2013-07-12 NOTE — Telephone Encounter (Signed)
Pt is past due for fu appointment. attempted to call pt and get appt scheduled. LM for pt to return call.

## 2013-07-13 ENCOUNTER — Other Ambulatory Visit: Payer: Self-pay | Admitting: Cardiology

## 2013-07-31 ENCOUNTER — Other Ambulatory Visit: Payer: Self-pay | Admitting: *Deleted

## 2013-07-31 MED ORDER — ATORVASTATIN CALCIUM 80 MG PO TABS: 80.0000 mg | ORAL_TABLET | Freq: Every day | ORAL | Status: DC

## 2013-08-15 ENCOUNTER — Ambulatory Visit: Payer: 59 | Admitting: Cardiology

## 2013-08-23 ENCOUNTER — Other Ambulatory Visit: Payer: Self-pay | Admitting: *Deleted

## 2013-08-23 MED ORDER — ATORVASTATIN CALCIUM 80 MG PO TABS: 80.0000 mg | ORAL_TABLET | Freq: Every day | ORAL | Status: DC

## 2013-08-23 NOTE — Telephone Encounter (Signed)
Called wife to inform her that patient needed to keep his up coming appointment and refill for lipitor would be sent to his pharmacy.

## 2013-09-01 ENCOUNTER — Other Ambulatory Visit: Payer: Self-pay | Admitting: Cardiology

## 2013-09-11 ENCOUNTER — Other Ambulatory Visit: Payer: Self-pay | Admitting: Cardiology

## 2013-09-12 ENCOUNTER — Telehealth: Payer: Self-pay | Admitting: *Deleted

## 2013-09-12 ENCOUNTER — Other Ambulatory Visit: Payer: Self-pay | Admitting: Cardiology

## 2013-09-12 ENCOUNTER — Other Ambulatory Visit: Payer: Self-pay | Admitting: *Deleted

## 2013-09-12 DIAGNOSIS — Z79899 Other long term (current) drug therapy: Secondary | ICD-10-CM

## 2013-09-12 DIAGNOSIS — E782 Mixed hyperlipidemia: Secondary | ICD-10-CM

## 2013-09-12 MED ORDER — CLOPIDOGREL BISULFATE 75 MG PO TABS: 75.0000 mg | ORAL_TABLET | Freq: Every day | ORAL | Status: DC

## 2013-09-12 NOTE — Telephone Encounter (Signed)
Medication sent to pharmacy.  Pt will get refills at next appointment

## 2013-09-12 NOTE — Telephone Encounter (Signed)
Called and informed patient that fasting lab work was requested before this visit. Wife confirmed that patient hasn't done any lipid labs for PCP. Lab orders faxed to [facility] lab. Patient will have this done this week.

## 2013-09-24 ENCOUNTER — Encounter: Payer: Self-pay | Admitting: *Deleted

## 2013-09-25 ENCOUNTER — Encounter: Payer: Self-pay | Admitting: Cardiology

## 2013-09-25 ENCOUNTER — Ambulatory Visit (INDEPENDENT_AMBULATORY_CARE_PROVIDER_SITE_OTHER): Payer: 59 | Admitting: Cardiology

## 2013-09-25 VITALS — BP 128/86 | HR 60 | Ht 70.0 in | Wt 221.0 lb

## 2013-09-25 DIAGNOSIS — I10 Essential (primary) hypertension: Secondary | ICD-10-CM

## 2013-09-25 DIAGNOSIS — F17200 Nicotine dependence, unspecified, uncomplicated: Secondary | ICD-10-CM

## 2013-09-25 DIAGNOSIS — E782 Mixed hyperlipidemia: Secondary | ICD-10-CM

## 2013-09-25 DIAGNOSIS — I2510 Atherosclerotic heart disease of native coronary artery without angina pectoris: Secondary | ICD-10-CM

## 2013-09-25 NOTE — Assessment & Plan Note (Signed)
Blood pressure control is reasonable today. 

## 2013-09-25 NOTE — Progress Notes (Signed)
Clinical Summary [name] is a 58 y.o.male last seen in August 2013. He is here with his wife today. 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 He reports no angina symptoms or nitroglycerin use. ECG today is normal.  Recent lab work in July showed normal LFTs, triglycerides 31, cholesterol 102, HDL 38, LDL 58.  He is trying to quit smoking with his wife. We talked about different strategies including nicotine replacement.   No Known Allergies  Current Outpatient Prescriptions  Medication Sig Dispense Refill  . acetaminophen (TYLENOL) 500 MG tablet Take 500 mg by mouth as needed.      [name] atorvastatin (LIPITOR) 80 MG tablet Take 1 tablet (80 mg total) by mouth daily.  30 tablet  0  . clopidogrel (PLAVIX) 75 MG tablet Take 1 tablet (75 mg total) by mouth daily.  30 tablet  0  . guaiFENesin (MUCINEX) 600 MG 12 hr tablet Take 600 mg by mouth as needed.      . loratadine (CLARITIN) 10 MG tablet Take 10 mg by mouth daily.        . metoprolol tartrate (LOPRESSOR) 25 MG tablet take 1/2 tablet by mouth twice a day  15 tablet  0  . Multiple Vitamin (MULTIVITAMIN) tablet Take 1 tablet by mouth daily.        [name] NITROSTAT 0.4 MG SL tablet place 1 tablet under the tongue every 5 minutes for UP TO 3 doses if needed for angina as directed by prescriber  25 each  3  . Omega-3 Fatty Acids (FISH OIL) 1000 MG CAPS Take 2 capsules by mouth 2 (two) times daily.        . tamsulosin (FLOMAX) 0.4 MG CAPS capsule Take 0.4 mg by mouth daily.      [name] aspirin 81 MG tablet Take 81 mg by mouth daily.         No current facility-administered medications for this visit.    Past Medical History  Diagnosis Date  . Coronary atherosclerosis of native coronary artery     DES RCA 2006, DES circumflex 4/12, occluded RCA with collaterals, LVEF 45%  . Myocardial infarction     NSTEMI 4/12  . Mixed hyperlipidemia   . Seasonal allergies     Social History [name] reports that he has been smoking Cigarettes.  He has  a 45 pack-year smoking history. He has never used smokeless tobacco. [name] reports that he does not drink alcohol.  Review of Systems No palpitations or syncope. No claudication. Arthritic knee pain. No orthopnea or PND. Other systems reviewed and negative.  Physical Examination Filed Vitals:   09/25/13 1541  BP: 128/86  Pulse: 60   Filed Weights   09/25/13 1541  Weight: 221 lb (100.245 kg)    Obese male in no acute distress.  HEENT: Conjunctiva and lids normal, oropharynx clear.  Neck: Supple, no elevated JVP or bruits, no thyromegaly.  Lungs: Clear to auscultation, nontender.  Cardiac: Regular rate and rhythm, no S3 or pericardial rub.  Abdomen: Obese, nontender, bowel sounds present.  Skin: Warm and dry, resolving ecchymosis in the right forearm.  Extremities: No pitting edema, distal pulses full.  Musculoskeletal: No kyphosis.  Neuropsychiatric: Alert and oriented x3, affect appropriate.   Problem List and Plan   Coronary atherosclerosis of native coronary artery Symptomatically stable medical therapy, ECG is normal today. No changes were made in cardiac medical regimen. Continue annual followup, sooner if needed.  Essential hypertension, benign Blood pressure control is  reasonable today.  Mixed hyperlipidemia Recent LDL 58 on statin therapy.  Tobacco use disorder Smoking cessation strategies discussed.    Satira Sark, M.D., F.A.C.C.

## 2013-09-25 NOTE — Assessment & Plan Note (Signed)
Smoking cessation strategies discussed.

## 2013-09-25 NOTE — Assessment & Plan Note (Signed)
Symptomatically stable medical therapy, ECG is normal today. No changes were made in cardiac medical regimen. Continue annual followup, sooner if needed.

## 2013-09-25 NOTE — Assessment & Plan Note (Signed)
Recent LDL 58 on statin therapy.

## 2013-09-25 NOTE — Patient Instructions (Signed)
Your physician recommends that you schedule a follow-up appointment in: 1 year. You will receive a reminder letter in the mail in about 10 months reminding you to call and schedule your appointment. If you don't receive this letter, please contact our office. Your physician recommends that you continue on your current medications as directed. Please refer to the Current Medication list given to you today. Your physician recommends that you have FASTING lipid/liver profile in 1 year just before your next visit. Please fast at least 8 hours when you have this done.

## 2013-09-27 ENCOUNTER — Other Ambulatory Visit: Payer: Self-pay | Admitting: Cardiology

## 2013-09-27 ENCOUNTER — Other Ambulatory Visit: Payer: Self-pay | Admitting: *Deleted

## 2013-09-27 ENCOUNTER — Other Ambulatory Visit: Payer: Self-pay | Admitting: Cardiovascular Disease

## 2013-09-27 MED ORDER — NITROGLYCERIN 0.4 MG SL SUBL: SUBLINGUAL_TABLET | SUBLINGUAL | Status: DC

## 2013-09-27 MED ORDER — ATORVASTATIN CALCIUM 80 MG PO TABS: 80.0000 mg | ORAL_TABLET | Freq: Every day | ORAL | Status: DC

## 2013-09-27 MED ORDER — CLOPIDOGREL BISULFATE 75 MG PO TABS: 75.0000 mg | ORAL_TABLET | Freq: Every day | ORAL | Status: DC

## 2014-07-22 ENCOUNTER — Other Ambulatory Visit: Payer: Self-pay | Admitting: Cardiology

## 2014-07-22 ENCOUNTER — Encounter: Payer: Self-pay | Admitting: *Deleted

## 2014-07-22 ENCOUNTER — Ambulatory Visit (INDEPENDENT_AMBULATORY_CARE_PROVIDER_SITE_OTHER): Payer: 59 | Admitting: Cardiology

## 2014-07-22 ENCOUNTER — Encounter: Payer: Self-pay | Admitting: Cardiology

## 2014-07-22 ENCOUNTER — Telehealth: Payer: Self-pay | Admitting: Cardiology

## 2014-07-22 VITALS — BP 117/77 | HR 69 | Ht 70.0 in | Wt 226.1 lb

## 2014-07-22 DIAGNOSIS — I200 Unstable angina: Secondary | ICD-10-CM

## 2014-07-22 DIAGNOSIS — F17200 Nicotine dependence, unspecified, uncomplicated: Secondary | ICD-10-CM

## 2014-07-22 DIAGNOSIS — Z720 Tobacco use: Secondary | ICD-10-CM | POA: Diagnosis not present

## 2014-07-22 DIAGNOSIS — E782 Mixed hyperlipidemia: Secondary | ICD-10-CM | POA: Diagnosis not present

## 2014-07-22 DIAGNOSIS — R0602 Shortness of breath: Secondary | ICD-10-CM

## 2014-07-22 DIAGNOSIS — I25119 Atherosclerotic heart disease of native coronary artery with unspecified angina pectoris: Secondary | ICD-10-CM

## 2014-07-22 NOTE — Progress Notes (Signed)
Cardiology Office Note  Date: 07/22/2014   ID: AKSH SWART, DOB 1955/07/21, MRN [id number]  PCP: Rory Percy, MD  Primary Cardiologist: Rozann Lesches, MD   Chief Complaint  Patient presents with  . Coronary Artery Disease  . Hyperlipidemia  . Shortness of Breath    History of Present Illness: [name] is a 59 y.o. male last seen in July 2015. Evaluation by Dr. Nadara Mustard noted in late April. He has been describing shortness of breath and exertional chest discomfort over the last 6 months, COPD has been diagnosed, and he has also been placed on Spiriva in addition to his baseline pulmonary regimen. 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 Chest pain symptoms have both typical and atypical features, sometimes sharp, and sometimes more of a tightness. He does not notice any substantial improvement on his pulmonary inhalers. Reports that he is not using nitroglycerin. He feels the symptoms of done worse.  Last coronary intervention as outlined below, underwent placement of DES to the circumflex in the setting of NSTEMI back in April 2012. He had documentation of chronic total occlusion of the RCA with excellent collaterals at that point.  We reviewed his cardiac medications, he reports no changes. Recent testing including echocardiogram and chest x-ray are outlined below. Today we discussed options for follow-up cardiac evaluation including both noninvasive and invasive techniques. After discussing the risks and benefits, plan is to pursue a cardiac catheterization to most clearly reassess his coronary anatomy in light of progressing symptoms.   Past Medical History  Diagnosis Date  . Coronary atherosclerosis of native coronary artery     DES RCA 2006, DES circumflex 4/12, occluded RCA with collaterals, LVEF 45%  . Myocardial infarction     NSTEMI 4/12  . Mixed hyperlipidemia   .  Seasonal allergies   . COPD (chronic obstructive pulmonary disease)     Past Surgical History  Procedure Laterality Date  . Nasal reconstruction surgery    . Cystoscopy  01/31/2013    Current Outpatient Prescriptions  Medication Sig Dispense Refill  . acetaminophen (TYLENOL) 500 MG tablet Take 500 mg by mouth as needed.    [name] aspirin 81 MG tablet Take 81 mg by mouth daily.      [name] atorvastatin (LIPITOR) 80 MG tablet Take 1 tablet (80 mg total) by mouth daily. 30 tablet 11  . ATROVENT HFA 17 MCG/ACT inhaler Inhale 1 puff into the lungs every 6 (six) hours as needed.   0  . clopidogrel (PLAVIX) 75 MG tablet Take 1 tablet (75 mg total) by mouth daily. 30 tablet 11  . guaiFENesin (MUCINEX) 600 MG 12 hr tablet Take 600 mg by mouth as needed.    . loratadine (CLARITIN) 10 MG tablet Take 10 mg by mouth daily.      . metoprolol tartrate (LOPRESSOR) 25 MG tablet take 1/2 tablet by mouth twice a day 30 tablet 11  . Multiple Vitamin (MULTIVITAMIN) tablet Take 1 tablet by mouth daily.      . nitroGLYCERIN (NITROSTAT) 0.4 MG SL tablet place 1 tablet under the tongue every 5 minutes for UP TO 3 doses if needed for angina as directed by prescriber 25 tablet 3  . Omega-3 Fatty Acids (FISH OIL) 1000 MG CAPS Take 2 capsules by mouth 2 (two) times daily.      [name] QVAR 80 MCG/ACT inhaler Inhale 2 puffs into the lungs 2 (  two) times daily.   1  . SPIRIVA HANDIHALER 18 MCG inhalation capsule Place 18 mcg into inhaler and inhale daily.   1  . tamsulosin (FLOMAX) 0.4 MG CAPS capsule Take 0.4 mg by mouth daily.    . VENTOLIN HFA 108 (90 BASE) MCG/ACT inhaler Inhale 2 puffs into the lungs 3 (three) times daily.   1   No current facility-administered medications for this visit.    Allergies:  Review of patient's allergies indicates no known allergies.   Social History: The patient  reports that he has been smoking Cigarettes.  He has a 22.5 pack-year smoking history. He has never used smokeless tobacco. He reports  that he does not drink alcohol or use illicit drugs.   Family History: The patient's family history includes Coronary artery disease in an other family member.   ROS:  Please see the history of present illness. Otherwise, complete review of systems is positive for seasonal allergies.  All other systems are reviewed and negative.   Physical Exam: VS:  BP 117/77 mmHg  Pulse 69  Ht 5\' 10"  (1.778 m)  Wt 226 lb 1.9 oz (102.567 kg)  BMI 32.44 kg/m2  SpO2 95%, BMI Body mass index is 32.44 kg/(m^2).  Wt Readings from Last 3 Encounters:  07/22/14 226 lb 1.9 oz (102.567 kg)  09/25/13 221 lb (100.245 kg)  11/01/11 212 lb 1.9 oz (96.217 kg)     General: Patient appears comfortable at rest. HEENT: Conjunctiva and lids normal, oropharynx clear. Neck: Supple, no elevated JVP or carotid bruits, no thyromegaly. Lungs: Decreased breath sounds but clear, nonlabored breathing at rest. Cardiac: Regular rate and rhythm, no S3 or significant systolic murmur, no pericardial rub. Abdomen: Soft, nontender, bowel sounds present, no guarding or rebound. Extremities: No pitting edema, distal pulses 2+. Skin: Warm and dry. Musculoskeletal: No kyphosis. Neuropsychiatric: Alert and oriented x3, affect grossly appropriate.   ECG: ECG is not ordered today.   Recent Labwork:  09/20/2013: AST 21, ALT 32, triglycerides 31, cholesterol 102, HDL 38, LDL 58  Other Studies Reviewed Today:  Echocardiogram 05/20/2014 [facility]) reported mild LVH with LVEF 60-65%, abnormal diastolic function unspecified, trace aortic regurgitation, RVSP 25 mmHg.  Chest x-ray 06/05/2014 ([facility]) reported possible bronchitic changes without infiltrates.  Assessment and Plan:  1. Progressing exertional shortness of breath and possible accelerating angina symptoms over the last few months. Patient has diagnosis of COPD with tobacco abuse, but also CAD and some features more suggestive of angina. Recent echocardiogram shows LVEF 60-65%  with normal RVSP. Chest x-ray reporting bronchitic changes. Last coronary intervention was approximately 4 years ago with DES to the circumflex in the setting of NSTEMI. As outlined above, plan is to pursue a follow-up diagnostic cardiac catheterization to clearly evaluate the coronary anatomy and reassess for revascularization options in light of progressing symptoms.  2. CAD with chronic occlusion of the RCA following remote intervention associated with good collaterals as of 2012, also DES to the circumflex in 2012.  3. Tobacco abuse. Smoking cessation has been discussed.  4. Hyperlipidemia, on statin therapy. Lipid control is been good over time, last LDL 58.  5. Diagnosis of COPD per Dr. Nadara Mustard, likely also contributing to shortness of breath.  Current medicines were reviewed with the patient today.   Disposition: FU with me in 1 month.   Signed, Satira Sark, MD, [facility] 07/22/2014 8:52 AM    [facility] [place], [place],  [address] Phone: [phone];  Fax: [phone]

## 2014-07-22 NOTE — Telephone Encounter (Signed)
Left heart cath on Thursday 07/26/14 @10 :30 am with Dr. Martinique dx: CAD, accelerating angina, & SOB

## 2014-07-22 NOTE — Patient Instructions (Addendum)
Your physician recommends that you continue on your current medications as directed. Please refer to the Current Medication list given to you today. Your physician has requested that you have a cardiac catheterization. Cardiac catheterization is used to diagnose and/or treat various heart conditions. Doctors may recommend this procedure for a number of different reasons. The most common reason is to evaluate chest pain. Chest pain can be a symptom of coronary artery disease (CAD), and cardiac catheterization can show whether plaque is narrowing or blocking your heart's arteries. This procedure is also used to evaluate the valves, as well as measure the blood flow and oxygen levels in different parts of your heart. For further information please visit www.cardiosmart.org. Please follow instruction sheet, as given. Your physician recommends that you schedule a follow-up appointment in: 1 month. 

## 2014-07-22 NOTE — Telephone Encounter (Signed)
AUTH # [id number] EXP 09/05/14

## 2014-07-26 ENCOUNTER — Ambulatory Visit (HOSPITAL_COMMUNITY)
Admission: RE | Admit: 2014-07-26 | Discharge: 2014-07-26 | Disposition: A | Discharge status: Home / Self Care | Payer: 59 | Source: Ambulatory Visit | Attending: Cardiovascular Disease | Admitting: Cardiovascular Disease

## 2014-07-26 ENCOUNTER — Encounter (HOSPITAL_COMMUNITY)
Admission: RE | Disposition: A | Discharge status: Home / Self Care | Payer: 59 | Source: Ambulatory Visit | Attending: Cardiovascular Disease

## 2014-07-26 ENCOUNTER — Encounter (HOSPITAL_COMMUNITY): Payer: Self-pay | Admitting: Cardiology

## 2014-07-26 DIAGNOSIS — E782 Mixed hyperlipidemia: Secondary | ICD-10-CM | POA: Insufficient documentation

## 2014-07-26 DIAGNOSIS — I200 Unstable angina: Secondary | ICD-10-CM

## 2014-07-26 DIAGNOSIS — F17200 Nicotine dependence, unspecified, uncomplicated: Secondary | ICD-10-CM | POA: Diagnosis present

## 2014-07-26 DIAGNOSIS — I252 Old myocardial infarction: Secondary | ICD-10-CM | POA: Diagnosis not present

## 2014-07-26 DIAGNOSIS — F17210 Nicotine dependence, cigarettes, uncomplicated: Secondary | ICD-10-CM | POA: Diagnosis not present

## 2014-07-26 DIAGNOSIS — I10 Essential (primary) hypertension: Secondary | ICD-10-CM | POA: Diagnosis present

## 2014-07-26 DIAGNOSIS — Z7982 Long term (current) use of aspirin: Secondary | ICD-10-CM | POA: Diagnosis not present

## 2014-07-26 DIAGNOSIS — Z955 Presence of coronary angioplasty implant and graft: Secondary | ICD-10-CM | POA: Diagnosis not present

## 2014-07-26 DIAGNOSIS — Z7902 Long term (current) use of antithrombotics/antiplatelets: Secondary | ICD-10-CM | POA: Diagnosis not present

## 2014-07-26 DIAGNOSIS — I2510 Atherosclerotic heart disease of native coronary artery without angina pectoris: Secondary | ICD-10-CM | POA: Insufficient documentation

## 2014-07-26 DIAGNOSIS — J449 Chronic obstructive pulmonary disease, unspecified: Secondary | ICD-10-CM | POA: Diagnosis not present

## 2014-07-26 DIAGNOSIS — I25119 Atherosclerotic heart disease of native coronary artery with unspecified angina pectoris: Secondary | ICD-10-CM | POA: Diagnosis present

## 2014-07-26 DIAGNOSIS — I209 Angina pectoris, unspecified: Secondary | ICD-10-CM

## 2014-07-26 DIAGNOSIS — R0609 Other forms of dyspnea: Secondary | ICD-10-CM | POA: Diagnosis present

## 2014-07-26 HISTORY — PX: CARDIAC CATHETERIZATION: SHX172

## 2014-07-26 LAB — CBC
HEMATOCRIT: 47.7 % (ref 39.0–52.0)
Hemoglobin: 16.7 g/dL (ref 13.0–17.0)
MCH: 32.6 pg (ref 26.0–34.0)
MCHC: 35 g/dL (ref 30.0–36.0)
MCV: 93 fL (ref 78.0–100.0)
Platelets: 288 10*3/uL (ref 150–400)
RBC: 5.13 MIL/uL (ref 4.22–5.81)
RDW: 13.8 % (ref 11.5–15.5)
WBC: 15.1 10*3/uL — ABNORMAL HIGH (ref 4.0–10.5)

## 2014-07-26 LAB — BASIC METABOLIC PANEL
ANION GAP: 7 (ref 5–15)
BUN: 12 mg/dL (ref 6–20)
CO2: 25 mmol/L (ref 22–32)
Calcium: 8.9 mg/dL (ref 8.9–10.3)
Chloride: 107 mmol/L (ref 101–111)
Creatinine, Ser: 0.76 mg/dL (ref 0.61–1.24)
GFR calc Af Amer: >60 mL/min (ref >60)
Glucose, Bld: 111 mg/dL — ABNORMAL HIGH (ref 65–99)
POTASSIUM: 3.9 mmol/L (ref 3.5–5.1)
Sodium: 139 mmol/L (ref 135–145)

## 2014-07-26 LAB — PROTIME-INR
INR: 1.02 (ref 0.00–1.49)
Prothrombin Time: 13.5 seconds (ref 11.6–15.2)

## 2014-07-26 SURGERY — LEFT HEART CATH AND CORONARY ANGIOGRAPHY
Anesthesia: LOCAL

## 2014-07-26 MED ORDER — LIDOCAINE HCL (PF) 1 % IJ SOLN
INTRAMUSCULAR | Status: AC
  Filled 2014-07-26: qty 30

## 2014-07-26 MED ORDER — HEPARIN SODIUM (PORCINE) 1000 UNIT/ML IJ SOLN
INTRAMUSCULAR | Status: AC
  Filled 2014-07-26: qty 1

## 2014-07-26 MED ORDER — SODIUM CHLORIDE 0.9 % IV SOLN
INTRAVENOUS | Status: DC
  Administered 2014-07-26: 09:00:00 via INTRAVENOUS

## 2014-07-26 MED ORDER — HEPARIN SODIUM (PORCINE) 1000 UNIT/ML IJ SOLN
INTRAMUSCULAR | Status: DC | PRN
  Administered 2014-07-26: 5000 [IU] via INTRAVENOUS

## 2014-07-26 MED ORDER — MIDAZOLAM HCL 2 MG/2ML IJ SOLN
INTRAMUSCULAR | Status: AC
  Filled 2014-07-26: qty 2

## 2014-07-26 MED ORDER — FENTANYL CITRATE (PF) 100 MCG/2ML IJ SOLN
INTRAMUSCULAR | Status: DC | PRN
  Administered 2014-07-26: 25 ug via INTRAVENOUS

## 2014-07-26 MED ORDER — SODIUM CHLORIDE 0.9 % IJ SOLN: 3.0000 mL | Freq: Two times a day (BID) | INTRAMUSCULAR | Status: DC

## 2014-07-26 MED ORDER — VERAPAMIL HCL 2.5 MG/ML IV SOLN
INTRAVENOUS | Status: DC | PRN
  Administered 2014-07-26: 13:00:00 via INTRA_ARTERIAL

## 2014-07-26 MED ORDER — SODIUM CHLORIDE 0.9 % IJ SOLN: 3.0000 mL | INTRAMUSCULAR | Status: DC | PRN

## 2014-07-26 MED ORDER — SODIUM CHLORIDE 0.9 % WEIGHT BASED INFUSION: 3.0000 mL/kg/h | INTRAVENOUS | Status: DC

## 2014-07-26 MED ORDER — IOHEXOL 350 MG/ML SOLN
INTRAVENOUS | Status: DC | PRN
  Administered 2014-07-26: 75 mL via INTRAVENOUS

## 2014-07-26 MED ORDER — HEPARIN (PORCINE) IN NACL 2-0.9 UNIT/ML-% IJ SOLN
INTRAMUSCULAR | Status: AC
  Filled 2014-07-26: qty 1000

## 2014-07-26 MED ORDER — SODIUM CHLORIDE 0.9 % IV SOLN: 250.0000 mL | INTRAVENOUS | Status: DC | PRN

## 2014-07-26 MED ORDER — MIDAZOLAM HCL 2 MG/2ML IJ SOLN
INTRAMUSCULAR | Status: DC | PRN
  Administered 2014-07-26: 1 mg via INTRAVENOUS

## 2014-07-26 MED ORDER — VERAPAMIL HCL 2.5 MG/ML IV SOLN
INTRAVENOUS | Status: AC
  Filled 2014-07-26: qty 2

## 2014-07-26 MED ORDER — FENTANYL CITRATE (PF) 100 MCG/2ML IJ SOLN
INTRAMUSCULAR | Status: AC
  Filled 2014-07-26: qty 2

## 2014-07-26 SURGICAL SUPPLY — 15 items
CATH INFINITI 5 FR JL3.5 (CATHETERS) ×2 IMPLANT
CATH INFINITI 5FR ANG PIGTAIL (CATHETERS) ×2 IMPLANT
CATH INFINITI 5FR MULTPACK ANG (CATHETERS) IMPLANT
CATH INFINITI JR4 5F (CATHETERS) ×2 IMPLANT
DEVICE RAD COMP TR BAND LRG (VASCULAR PRODUCTS) ×2 IMPLANT
GLIDESHEATH SLEND SS 6F .021 (SHEATH) ×2 IMPLANT
KIT HEART LEFT (KITS) ×2 IMPLANT
PACK CARDIAC CATHETERIZATION (CUSTOM PROCEDURE TRAY) ×2 IMPLANT
SHEATH PINNACLE 5F 10CM (SHEATH) IMPLANT
SYR MEDRAD MARK V 150ML (SYRINGE) ×2 IMPLANT
TRANSDUCER W/STOPCOCK (MISCELLANEOUS) ×2 IMPLANT
TUBING CIL FLEX 10 FLL-RA (TUBING) ×2 IMPLANT
WIRE EMERALD 3MM-J .035X150CM (WIRE) IMPLANT
WIRE HITORQ VERSACORE ST 145CM (WIRE) ×2 IMPLANT
WIRE SAFE-T 1.5MM-J .035X260CM (WIRE) ×2 IMPLANT

## 2014-07-26 NOTE — Discharge Instructions (Signed)
Radial Site Care °Refer to this sheet in the next few weeks. These instructions provide you with information on caring for yourself after your procedure. Your caregiver may also give you more specific instructions. Your treatment has been planned according to current medical practices, but problems sometimes occur. Call your caregiver if you have any problems or questions after your procedure. °HOME CARE INSTRUCTIONS °· You may shower the day after the procedure. Remove the bandage (dressing) and gently wash the site with plain soap and water. Gently pat the site dry. °· Do not apply powder or lotion to the site. °· Do not submerge the affected site in water for 3 to 5 days. °· Inspect the site at least twice daily. °· Do not flex or bend the affected arm for 24 hours. °· No lifting over 5 pounds (2.3 kg) for 5 days after your procedure. °· Do not drive home if you are discharged the same day of the procedure. Have someone else drive you. °· You may drive 24 hours after the procedure unless otherwise instructed by your caregiver. °· Do not operate machinery or power tools for 24 hours. °· A responsible adult should be with you for the first 24 hours after you arrive home. °What to expect: °· Any bruising will usually fade within 1 to 2 weeks. °· Blood that collects in the tissue (hematoma) may be painful to the touch. It should usually decrease in size and tenderness within 1 to 2 weeks. °SEEK IMMEDIATE MEDICAL CARE IF: °· You have unusual pain at the radial site. °· You have redness, warmth, swelling, or pain at the radial site. °· You have drainage (other than a small amount of blood on the dressing). °· You have chills. °· You have a fever or persistent symptoms for more than 72 hours. °· You have a fever and your symptoms suddenly get worse. °· Your arm becomes pale, cool, tingly, or numb. °· You have heavy bleeding from the site. Hold pressure on the site. °Document Released: 04/03/2010 Document Revised:  05/24/2011 Document Reviewed: 04/03/2010 °Exit[employer]® Patient Information ©2015 [employer]. This information is not intended to replace advice given to you by your health care provider. Make sure you discuss any questions you have with your health care provider. ° °

## 2014-07-26 NOTE — H&P (View-Only) (Signed)
Cardiology Office Note  Date: 07/22/2014   ID: Edward FEDERICI, DOB Jul 08, 1955, MRN [id number]  PCP: Rory Percy, MD  Primary Cardiologist: Rozann Lesches, MD   Chief Complaint  Patient presents with  . Coronary Artery Disease  . Hyperlipidemia  . Shortness of Breath    History of Present Illness: [name] is a 59 y.o. male last seen in July 2015. Evaluation by Dr. Nadara Mustard noted in late April. He has been describing shortness of breath and exertional chest discomfort over the last 6 months, COPD has been diagnosed, and he has also been placed on Spiriva in addition to his baseline pulmonary regimen. 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 Chest pain symptoms have both typical and atypical features, sometimes sharp, and sometimes more of a tightness. He does not notice any substantial improvement on his pulmonary inhalers. Reports that he is not using nitroglycerin. He feels the symptoms of done worse.  Last coronary intervention as outlined below, underwent placement of DES to the circumflex in the setting of NSTEMI back in April 2012. He had documentation of chronic total occlusion of the RCA with excellent collaterals at that point.  We reviewed his cardiac medications, he reports no changes. Recent testing including echocardiogram and chest x-ray are outlined below. Today we discussed options for follow-up cardiac evaluation including both noninvasive and invasive techniques. After discussing the risks and benefits, plan is to pursue a cardiac catheterization to most clearly reassess his coronary anatomy in light of progressing symptoms.   Past Medical History  Diagnosis Date  . Coronary atherosclerosis of native coronary artery     DES RCA 2006, DES circumflex 4/12, occluded RCA with collaterals, LVEF 45%  . Myocardial infarction     NSTEMI 4/12  . Mixed hyperlipidemia   .  Seasonal allergies   . COPD (chronic obstructive pulmonary disease)     Past Surgical History  Procedure Laterality Date  . Nasal reconstruction surgery    . Cystoscopy  01/31/2013    Current Outpatient Prescriptions  Medication Sig Dispense Refill  . acetaminophen (TYLENOL) 500 MG tablet Take 500 mg by mouth as needed.    [name] aspirin 81 MG tablet Take 81 mg by mouth daily.      [name] atorvastatin (LIPITOR) 80 MG tablet Take 1 tablet (80 mg total) by mouth daily. 30 tablet 11  . ATROVENT HFA 17 MCG/ACT inhaler Inhale 1 puff into the lungs every 6 (six) hours as needed.   0  . clopidogrel (PLAVIX) 75 MG tablet Take 1 tablet (75 mg total) by mouth daily. 30 tablet 11  . guaiFENesin (MUCINEX) 600 MG 12 hr tablet Take 600 mg by mouth as needed.    . loratadine (CLARITIN) 10 MG tablet Take 10 mg by mouth daily.      . metoprolol tartrate (LOPRESSOR) 25 MG tablet take 1/2 tablet by mouth twice a day 30 tablet 11  . Multiple Vitamin (MULTIVITAMIN) tablet Take 1 tablet by mouth daily.      . nitroGLYCERIN (NITROSTAT) 0.4 MG SL tablet place 1 tablet under the tongue every 5 minutes for UP TO 3 doses if needed for angina as directed by prescriber 25 tablet 3  . Omega-3 Fatty Acids (FISH OIL) 1000 MG CAPS Take 2 capsules by mouth 2 (two) times daily.      [name] QVAR 80 MCG/ACT inhaler Inhale 2 puffs into the lungs 2 (  two) times daily.   1  . SPIRIVA HANDIHALER 18 MCG inhalation capsule Place 18 mcg into inhaler and inhale daily.   1  . tamsulosin (FLOMAX) 0.4 MG CAPS capsule Take 0.4 mg by mouth daily.    . VENTOLIN HFA 108 (90 BASE) MCG/ACT inhaler Inhale 2 puffs into the lungs 3 (three) times daily.   1   No current facility-administered medications for this visit.    Allergies:  Review of patient's allergies indicates no known allergies.   Social History: The patient  reports that he has been smoking Cigarettes.  He has a 22.5 pack-year smoking history. He has never used smokeless tobacco. He reports  that he does not drink alcohol or use illicit drugs.   Family History: The patient's family history includes Coronary artery disease in an other family member.   ROS:  Please see the history of present illness. Otherwise, complete review of systems is positive for seasonal allergies.  All other systems are reviewed and negative.   Physical Exam: VS:  BP 117/77 mmHg  Pulse 69  Ht 5\' 10"  (1.778 m)  Wt 226 lb 1.9 oz (102.567 kg)  BMI 32.44 kg/m2  SpO2 95%, BMI Body mass index is 32.44 kg/(m^2).  Wt Readings from Last 3 Encounters:  07/22/14 226 lb 1.9 oz (102.567 kg)  09/25/13 221 lb (100.245 kg)  11/01/11 212 lb 1.9 oz (96.217 kg)     General: Patient appears comfortable at rest. HEENT: Conjunctiva and lids normal, oropharynx clear. Neck: Supple, no elevated JVP or carotid bruits, no thyromegaly. Lungs: Decreased breath sounds but clear, nonlabored breathing at rest. Cardiac: Regular rate and rhythm, no S3 or significant systolic murmur, no pericardial rub. Abdomen: Soft, nontender, bowel sounds present, no guarding or rebound. Extremities: No pitting edema, distal pulses 2+. Skin: Warm and dry. Musculoskeletal: No kyphosis. Neuropsychiatric: Alert and oriented x3, affect grossly appropriate.   ECG: ECG is not ordered today.   Recent Labwork:  09/20/2013: AST 21, ALT 32, triglycerides 31, cholesterol 102, HDL 38, LDL 58  Other Studies Reviewed Today:  Echocardiogram 05/20/2014 [facility]) reported mild LVH with LVEF 60-65%, abnormal diastolic function unspecified, trace aortic regurgitation, RVSP 25 mmHg.  Chest x-ray 06/05/2014 ([facility]) reported possible bronchitic changes without infiltrates.  Assessment and Plan:  1. Progressing exertional shortness of breath and possible accelerating angina symptoms over the last few months. Patient has diagnosis of COPD with tobacco abuse, but also CAD and some features more suggestive of angina. Recent echocardiogram shows LVEF 60-65%  with normal RVSP. Chest x-ray reporting bronchitic changes. Last coronary intervention was approximately 4 years ago with DES to the circumflex in the setting of NSTEMI. As outlined above, plan is to pursue a follow-up diagnostic cardiac catheterization to clearly evaluate the coronary anatomy and reassess for revascularization options in light of progressing symptoms.  2. CAD with chronic occlusion of the RCA following remote intervention associated with good collaterals as of 2012, also DES to the circumflex in 2012.  3. Tobacco abuse. Smoking cessation has been discussed.  4. Hyperlipidemia, on statin therapy. Lipid control is been good over time, last LDL 58.  5. Diagnosis of COPD per Dr. Nadara Mustard, likely also contributing to shortness of breath.  Current medicines were reviewed with the patient today.   Disposition: FU with me in 1 month.   Signed, Satira Sark, MD, [facility] 07/22/2014 8:52 AM    [facility] [place], [place], [place] [address] Phone: [phone];  Fax: [phone]

## 2014-07-26 NOTE — Interval H&P Note (Signed)
History and Physical Interval Note:  07/26/2014 12:25 PM  [name]  has presented today for surgery, with the diagnosis of Chest Pain  The various methods of treatment have been discussed with the patient and family. After consideration of risks, benefits and other options for treatment, the patient has consented to  Procedure(s): Left Heart Cath and Coronary Angiography (N/A) as a surgical intervention .  The patient's history has been reviewed, patient examined, no change in status, stable for surgery.  I have reviewed the patient's chart and labs.  Questions were answered to the patient's satisfaction.   Cath Lab Visit (complete for each Cath Lab visit)  Clinical Evaluation Leading to the Procedure:   ACS: No.  Non-ACS:    Anginal Classification: CCS III  Anti-ischemic medical therapy: Minimal Therapy (1 class of medications)  Non-Invasive Test Results: No non-invasive testing performed  Prior CABG: No previous CABG        [name] [facility] 07/26/2014 12:25 PM

## 2014-07-29 MED FILL — Heparin Sodium (Porcine) 2 Unit/ML in Sodium Chloride 0.9%: INTRAMUSCULAR | Qty: 1000 | Status: AC

## 2014-07-29 MED FILL — Lidocaine HCl Local Preservative Free (PF) Inj 1%: INTRAMUSCULAR | Qty: 30 | Status: AC

## 2014-08-26 ENCOUNTER — Encounter: Payer: Self-pay | Admitting: Cardiology

## 2014-08-26 ENCOUNTER — Ambulatory Visit (INDEPENDENT_AMBULATORY_CARE_PROVIDER_SITE_OTHER): Payer: 59 | Admitting: Cardiology

## 2014-08-26 VITALS — BP 120/70 | HR 67 | Ht 70.0 in | Wt 230.0 lb

## 2014-08-26 DIAGNOSIS — I25119 Atherosclerotic heart disease of native coronary artery with unspecified angina pectoris: Secondary | ICD-10-CM | POA: Diagnosis not present

## 2014-08-26 DIAGNOSIS — E782 Mixed hyperlipidemia: Secondary | ICD-10-CM | POA: Diagnosis not present

## 2014-08-26 NOTE — Progress Notes (Signed)
Cardiology Office Note  Date: 08/26/2014   ID: [name], DOB [birth year], MRN [id number]  PCP: Rory Percy, MD  Primary Cardiologist: Rozann Lesches, MD   Chief Complaint  Patient presents with  . Coronary Artery Disease  . Hyperlipidemia    History of Present Illness: [name] is a 59 y.o. male seen recently in May with increasing symptoms including shortness of breath and chest tightness. He was referred at that time for a diagnostic cardiac catheterization. Procedure was performed by Dr. Martinique as noted below, findings essentially including single-vessel CAD with chronic total occlusion of the mid RCA at prior stent site, also occlusion of the PDA with left-to-right collaterals from the LAD, patent stent site within the circumflex. Medical therapy was recommended.  He is here today with his wife for a follow-up visit. He does have exertional angina symptoms, depending on level of activity. For instance he did have angina this weekend when cutting his grass. Symptoms tend to go away fairly quickly at rest, he has not required any nitroglycerin.  We discussed the findings of his recent cardiac catheterization, and plan to continue medical therapy. We could add a long-acting nitrate such as Imdur next depending on frequency of symptoms. 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   Past Medical History  Diagnosis Date  . Coronary atherosclerosis of native coronary artery     DES RCA 2006, DES circumflex 4/12, occluded RCA with collaterals, LVEF 45%  . Myocardial infarction     NSTEMI 4/12  . Mixed hyperlipidemia   . Seasonal allergies   . COPD (chronic obstructive pulmonary disease)      Current Outpatient Prescriptions  Medication Sig Dispense Refill  . acetaminophen (TYLENOL) 500 MG tablet Take 1,000 mg by mouth every 4 (four) hours as needed for moderate pain.     [name] aspirin 81  MG tablet Take 81 mg by mouth daily.      [name] atorvastatin (LIPITOR) 80 MG tablet Take 1 tablet (80 mg total) by mouth daily. 30 tablet 11  . ATROVENT HFA 17 MCG/ACT inhaler Inhale 2 puffs into the lungs every 6 (six) hours as needed for wheezing.   0  . clopidogrel (PLAVIX) 75 MG tablet Take 1 tablet (75 mg total) by mouth daily. 30 tablet 11  . guaiFENesin (MUCINEX) 600 MG 12 hr tablet Take 1,200 mg by mouth 2 (two) times daily.     [name] loratadine (CLARITIN) 10 MG tablet Take 10 mg by mouth daily.      . metoprolol tartrate (LOPRESSOR) 25 MG tablet take 1/2 tablet by mouth twice a day (Patient taking differently: Take 12.5mg  by mouth twice daily) 30 tablet 11  . Multiple Vitamin (MULTIVITAMIN) tablet Take 1 tablet by mouth daily.      . nitroGLYCERIN (NITROSTAT) 0.4 MG SL tablet place 1 tablet under the tongue every 5 minutes for UP TO 3 doses if needed for angina as directed by prescriber 25 tablet 3  . Omega-3 Fatty Acids (FISH OIL) 1000 MG CAPS Take 2,000 mg by mouth 2 (two) times daily.     [name] QVAR 80 MCG/ACT inhaler Inhale 2 puffs into the lungs 2 (two) times daily.   1  . SPIRIVA HANDIHALER 18 MCG inhalation capsule Place 18 mcg into inhaler and inhale 2 (two) times daily.   1  . tamsulosin (FLOMAX) 0.4 MG CAPS capsule Take 0.4 mg by  mouth daily.    . VENTOLIN HFA 108 (90 BASE) MCG/ACT inhaler Inhale 2 puffs into the lungs every 4 (four) hours as needed for wheezing or shortness of breath.   1   No current facility-administered medications for this visit.    Allergies:  Review of patient's allergies indicates no known allergies.   Social History: The patient  reports that he has been smoking Cigarettes.  He has a 22.5 pack-year smoking history. He has never used smokeless tobacco. He reports that he does not drink alcohol or use illicit drugs.    ROS:  Please see the history of present illness. Otherwise, complete review of systems is positive for none.  All other systems are reviewed and  negative.   Physical Exam: VS:  BP 120/70 mmHg  Pulse 67  Ht 5\' 10"  (1.778 m)  Wt 230 lb (104.327 kg)  BMI 33.00 kg/m2  SpO2 93%, BMI Body mass index is 33 kg/(m^2).  Wt Readings from Last 3 Encounters:  08/26/14 230 lb (104.327 kg)  07/26/14 225 lb (102.059 kg)  07/22/14 226 lb 1.9 oz (102.567 kg)     General: Patient appears comfortable at rest. HEENT: Conjunctiva and lids normal, oropharynx clear. Neck: Supple, no elevated JVP or carotid bruits, no thyromegaly. Lungs: Decreased breath sounds but clear, nonlabored breathing at rest. Cardiac: Regular rate and rhythm, no S3 or significant systolic murmur, no pericardial rub. Abdomen: Soft, nontender, bowel sounds present, no guarding or rebound. Extremities: No pitting edema, distal pulses 2+. Skin: Warm and dry. Musculoskeletal: No kyphosis. Neuropsychiatric: Alert and oriented x3, affect grossly appropriate.   ECG: Tracing from 07/26/2014 showed normal sinus rhythm with old inferior infarct pattern.   Recent Labwork: 07/26/2014: BUN 12; Creatinine, Ser 0.76; Hemoglobin 16.7; Platelets 288; Potassium 3.9; Sodium 139   Other Studies Reviewed Today:  1. Echocardiogram 05/20/2014 [facility]) reported mild LVH with LVEF 60-65%, abnormal diastolic function unspecified, trace aortic regurgitation, RVSP 25 mmHg.  2. Cardiac catheterization 07/26/2014: Coronary Findings    Dominance: Right   Left Main  The vessel is angiographically normal.     Left Anterior Descending  The vessel exhibits minimal luminal irregularities.   Jorene Minors LAD lesion, 30% stenosed.     Left Circumflex   . Dist Cx lesion, 10% stenosed. The lesion was previously treated with a drug-eluting stent greater than two years ago.     Right Coronary Artery   . Mid RCA lesion, 100% stenosed. , and has left-to-right collateral flow. The lesion was previously treated with a stent (unknown type) .   [name] Mid RCA to Dist RCA lesion, 100% stenosed. The lesion was  previously treated with a stent (unknown type) .   [name] Right Posterior Descending Artery   RPDA filled by collaterals from Dist LAD.   [name] RPDA lesion, 100% stenosed. The lesion was previously treated with a stent (unknown type) .      Wall Motion                 Left Heart    Left Ventricle The left ventricular size is normal. The left ventricular systolic function is normal. The left ventricular ejection fraction is 55-65% by visual estimate. There are wall motion abnormalities in the left ventricle. basal inferior hypokinesis There are segmental wall motion abnormalities in the left ventricle.    Assessment and Plan:  1. Exertional angina with CAD as outlined above. He has chronic total occlusion in the RCA distribution with some left or right collaterals,  and patent stent site in the circumflex as recently documented. I think that he will likely be able to return to work, although may have to modify some of his job responsibility such as climbing and lifting, particularly in extremes of temperatures. He states that he has worked in his capacity for 40 years, and has substantial experience that would likely still be quite valuable on the job site. I asked him to let us know if he has more frequent exertional angina, and we might try adding Imdur. He plans to follow-up with Dr. Nadara Mustard next.  2. Hyperlipidemia, continues on Lipitor.  Current medicines were reviewed with the patient today.   Disposition: FU with me in 4 months.   Signed, Satira Sark, MD, [facility] 08/26/2014 9:46 AM    [facility] [place], [place], [place] [address] Phone: [phone]; Fax: [phone]

## 2014-08-26 NOTE — Patient Instructions (Addendum)
Your physician recommends that you continue on your current medications as directed. Please refer to the Current Medication list given to you today. Your physician recommends that you schedule a follow-up appointment in: 4 months. You will receive a reminder letter in the mail in about 2 months reminding you to call and schedule your appointment. If you don't receive this letter, please contact our office. 

## 2014-09-03 ENCOUNTER — Telehealth: Payer: Self-pay | Admitting: Cardiology

## 2014-09-03 NOTE — Telephone Encounter (Signed)
Patient does want to start taking Nitro time release  Please send rx to  [facility]

## 2014-09-03 NOTE — Telephone Encounter (Signed)
May start imdur 15 mg daily.

## 2014-09-03 NOTE — Telephone Encounter (Signed)
Patient advised that MD is out of the office and it may be next week before the prescription is sent. Patient verbalized understanding of plan.

## 2014-09-04 MED ORDER — ISOSORBIDE MONONITRATE ER 30 MG PO TB24: 15.0000 mg | ORAL_TABLET | Freq: Every day | ORAL | Status: DC

## 2014-09-04 NOTE — Telephone Encounter (Signed)
Patient informed. 

## 2014-09-09 ENCOUNTER — Telehealth: Payer: Self-pay | Admitting: *Deleted

## 2014-09-09 NOTE — Telephone Encounter (Signed)
Per wife, patient took one dose of imdur yesterday and said it caused a terrible headache. Patient said he did take medication for the headache and it didn't help. Nurse advised patient that this was a side effect of imdur and if he wasn't able to tolerate it that he could stop it for now. Patient and wife verbalized understanding of plan.

## 2014-09-30 ENCOUNTER — Other Ambulatory Visit: Payer: Self-pay | Admitting: *Deleted

## 2014-09-30 MED ORDER — CLOPIDOGREL BISULFATE 75 MG PO TABS: 75.0000 mg | ORAL_TABLET | Freq: Every day | ORAL | Status: DC

## 2014-10-21 ENCOUNTER — Other Ambulatory Visit: Payer: Self-pay | Admitting: *Deleted

## 2014-10-21 MED ORDER — ATORVASTATIN CALCIUM 80 MG PO TABS: 80.0000 mg | ORAL_TABLET | Freq: Every day | ORAL | Status: DC

## 2014-10-22 ENCOUNTER — Other Ambulatory Visit: Payer: Self-pay | Admitting: Cardiology

## 2014-12-31 ENCOUNTER — Ambulatory Visit: Payer: 59 | Admitting: Cardiology

## 2015-01-07 ENCOUNTER — Ambulatory Visit (INDEPENDENT_AMBULATORY_CARE_PROVIDER_SITE_OTHER): Payer: Commercial Managed Care - HMO | Admitting: Cardiology

## 2015-01-07 ENCOUNTER — Encounter: Payer: Self-pay | Admitting: Cardiology

## 2015-01-07 VITALS — BP 112/68 | HR 59 | Ht 70.0 in | Wt 232.0 lb

## 2015-01-07 DIAGNOSIS — E782 Mixed hyperlipidemia: Secondary | ICD-10-CM

## 2015-01-07 DIAGNOSIS — I25119 Atherosclerotic heart disease of native coronary artery with unspecified angina pectoris: Secondary | ICD-10-CM

## 2015-01-07 NOTE — Progress Notes (Signed)
Cardiology Office Note  Date: 01/07/2015   ID: Thedford, Bunton 03/05/1956, MRN [id number]  PCP: Rory Percy, MD  Primary Cardiologist: Rozann Lesches, MD   Chief Complaint  Patient presents with  . Coronary Artery Disease    History of Present Illness: [name] is a 59 y.o. male last seen in June. He is here today with his wife for a follow-up visit. Still working full-time as a Software engineer. He reports fewer episodes of angina since I saw him. We did try and add low-dose Imdur for angina control, however he did not tolerate this due to headaches. He otherwise reports compliance with his medications which are reviewed below.  Cardiac catheterization from May demonstrated single-vessel CAD with chronic total occlusion of the mid RCA at prior stent site, also occlusion of the PDA with left-to-right collaterals from the LAD, patent stent site within the circumflex. Medical therapy was recommended.  He continues to follow with Dr. Nadara Mustard for primary care. No other recent major health concerns.  Past Medical History  Diagnosis Date  . Coronary atherosclerosis of native coronary artery     DES RCA 2006, DES circumflex 4/12, occluded RCA with collaterals, LVEF 45%  . Myocardial infarction ([place])     NSTEMI 4/12  . Mixed hyperlipidemia   . Seasonal allergies   . COPD (chronic obstructive pulmonary disease) ([place])     Current Outpatient Prescriptions  Medication Sig Dispense Refill  . acetaminophen (TYLENOL) 500 MG tablet Take 1,000 mg by mouth every 4 (four) hours as needed for moderate pain.     [name] aspirin 81 MG tablet Take 81 mg by mouth daily.      [name] atorvastatin (LIPITOR) 80 MG tablet Take 1 tablet (80 mg total) by mouth daily. 30 tablet 11  . ATROVENT HFA 17 MCG/ACT inhaler Inhale 2 puffs into the lungs every 6 (six) hours as needed for wheezing.   0  . clopidogrel (PLAVIX) 75 MG tablet Take 1 tablet (75 mg total) by mouth daily. 30 tablet 6  . guaiFENesin  (MUCINEX) 600 MG 12 hr tablet Take 1,200 mg by mouth 2 (two) times daily.     [name] loratadine (CLARITIN) 10 MG tablet Take 10 mg by mouth daily.      . metoprolol tartrate (LOPRESSOR) 25 MG tablet take 1/2 tablet by mouth twice a day 30 tablet 11  . Multiple Vitamin (MULTIVITAMIN) tablet Take 1 tablet by mouth daily.      . nitroGLYCERIN (NITROSTAT) 0.4 MG SL tablet place 1 tablet under the tongue every 5 minutes for UP TO 3 doses if needed for angina as directed by prescriber 25 tablet 3  . Omega-3 Fatty Acids (FISH OIL) 1000 MG CAPS Take 2,000 mg by mouth 2 (two) times daily.     [name] QVAR 80 MCG/ACT inhaler Inhale 2 puffs into the lungs 2 (two) times daily.   1  . SPIRIVA HANDIHALER 18 MCG inhalation capsule Place 18 mcg into inhaler and inhale 2 (two) times daily.   1  . tamsulosin (FLOMAX) 0.4 MG CAPS capsule Take 0.4 mg by mouth daily.    . VENTOLIN HFA 108 (90 BASE) MCG/ACT inhaler Inhale 2 puffs into the lungs every 4 (four) hours as needed for wheezing or shortness of breath.   1   No current facility-administered medications for this visit.    Allergies:  Review of patient's allergies indicates no known allergies.   Social History: The patient  reports that he quit  smoking about 4 months ago. His smoking use included Cigarettes. He has a 22.5 pack-year smoking history. He has never used smokeless tobacco. He reports that he does not drink alcohol or use illicit drugs.   ROS:  Please see the history of present illness. Otherwise, complete review of systems is positive for arthritic pains.  All other systems are reviewed and negative.   Physical Exam: VS:  BP 112/68 mmHg  Pulse 59  Ht 5\' 10"  (1.778 m)  Wt 232 lb (105.235 kg)  BMI 33.29 kg/m2  SpO2 97%, BMI Body mass index is 33.29 kg/(m^2).  Wt Readings from Last 3 Encounters:  01/07/15 232 lb (105.235 kg)  08/26/14 230 lb (104.327 kg)  07/26/14 225 lb (102.059 kg)     General: Patient appears comfortable at rest. HEENT:  Conjunctiva and lids normal, oropharynx clear. Neck: Supple, no elevated JVP or carotid bruits, no thyromegaly. Lungs: Decreased breath sounds but clear, nonlabored breathing at rest. Cardiac: Regular rate and rhythm, no S3 or significant systolic murmur, no pericardial rub. Abdomen: Soft, nontender, bowel sounds present, no guarding or rebound. Extremities: No pitting edema, distal pulses 2+.   ECG: ECG is not ordered today.   Recent Labwork: 07/26/2014: BUN 12; Creatinine, Ser 0.76; Hemoglobin 16.7; Platelets 288; Potassium 3.9; Sodium 139   Other Studies Reviewed Today:  Echocardiogram 05/20/2014 [facility]) reported mild LVH with LVEF 60-65%, abnormal diastolic function unspecified, trace aortic regurgitation, RVSP 25 mmHg.  Assessment and Plan:  1. Stable angina symptoms with CAD including occlusion of the mid RCA at previous stent site, occlusion of the PDA with left-to-right collaterals from the LAD, and patent stent site within the circumflex. Plan is to continue current medical regimen and observation.  2. Hyperlipidemia, on Lipitor and omega-3 suppleness. Keep follow-up with Dr. Nadara Mustard.  Current medicines were reviewed with the patient today.  Disposition: FU with me in 6 months.   Signed, Satira Sark, MD, [facility] 01/07/2015 10:54 AM    [facility] [place], [place], [place] [address] Phone: [phone]; Fax: [phone]

## 2015-01-07 NOTE — Patient Instructions (Signed)
Your physician recommends that you continue on your current medications as directed. Please refer to the Current Medication list given to you today. Your physician recommends that you schedule a follow-up appointment in: 6 months. You will receive a reminder letter in the mail in about 4 months reminding you to call and schedule your appointment. If you don't receive this letter, please contact our office. 

## 2015-05-16 ENCOUNTER — Other Ambulatory Visit: Payer: Self-pay | Admitting: *Deleted

## 2015-05-16 MED ORDER — CLOPIDOGREL BISULFATE 75 MG PO TABS: 75.0000 mg | ORAL_TABLET | Freq: Every day | ORAL | Status: DC

## 2015-07-10 ENCOUNTER — Ambulatory Visit: Payer: Commercial Managed Care - HMO | Admitting: Cardiology

## 2015-08-01 ENCOUNTER — Encounter: Payer: Self-pay | Admitting: Cardiology

## 2015-08-01 ENCOUNTER — Encounter: Payer: Self-pay | Admitting: *Deleted

## 2015-08-01 ENCOUNTER — Ambulatory Visit (INDEPENDENT_AMBULATORY_CARE_PROVIDER_SITE_OTHER): Payer: Commercial Managed Care - HMO | Admitting: Cardiology

## 2015-08-01 VITALS — BP 118/75 | HR 65 | Ht 70.0 in | Wt 223.4 lb

## 2015-08-01 DIAGNOSIS — I2510 Atherosclerotic heart disease of native coronary artery without angina pectoris: Secondary | ICD-10-CM | POA: Diagnosis not present

## 2015-08-01 DIAGNOSIS — E782 Mixed hyperlipidemia: Secondary | ICD-10-CM

## 2015-08-01 NOTE — Patient Instructions (Signed)
Your physician recommends that you continue on your current medications as directed. Please refer to the Current Medication list given to you today. Your physician recommends that you schedule a follow-up appointment in: 6 months. You will receive a reminder letter in the mail in about 4 months reminding you to call and schedule your appointment. If you don't receive this letter, please contact our office. 

## 2015-08-01 NOTE — Progress Notes (Signed)
Cardiology Office Note  Date: 08/01/2015   ID: [name], DOB 26-May-1955, MRN [id number]  PCP: Rory Percy, MD  Primary Cardiologist: Rozann Lesches, MD   Chief Complaint  Patient presents with  . Coronary Artery Disease  . Cardiomyopathy    History of Present Illness: [name] is a 60 y.o. male last seen in October 2016. He presents with his wife for a follow-up visit. Still working full-time as a Software engineer. Reports no angina symptoms on current regimen. Does get short of breath with higher heat and humidity.  I reviewed his medications which are outlined below. Cardiac regimen includes aspirin, Lipitor, Plavix, Lopressor, omega-3 supplements, and as needed nitroglycerin. I am requesting the last lab work from Dr. Nadara Mustard.  Cardiac catheterization from May 2016 is outlined below. Coronary disease was best managed medically.  Past Medical History  Diagnosis Date  . Coronary atherosclerosis of native coronary artery     DES RCA 2006, DES circumflex 4/12, occluded RCA with collaterals, LVEF 45%  . Myocardial infarction ([place])     NSTEMI 4/12  . Mixed hyperlipidemia   . Seasonal allergies   . COPD (chronic obstructive pulmonary disease) ([place])     Current Outpatient Prescriptions  Medication Sig Dispense Refill  . acetaminophen (TYLENOL) 500 MG tablet Take 1,000 mg by mouth every 4 (four) hours as needed for moderate pain.     [name] aspirin 81 MG tablet Take 81 mg by mouth daily.    [name] atorvastatin (LIPITOR) 80 MG tablet Take 1 tablet (80 mg total) by mouth daily. 30 tablet 11  . ATROVENT HFA 17 MCG/ACT inhaler Inhale 2 puffs into the lungs every 6 (six) hours as needed for wheezing.   0  . clopidogrel (PLAVIX) 75 MG tablet Take 1 tablet (75 mg total) by mouth daily. 30 tablet 6  . guaiFENesin (MUCINEX) 600 MG 12 hr tablet Take 1,200 mg by mouth 2 (two) times daily.     [name] loratadine (CLARITIN) 10 MG tablet Take 10 mg by mouth daily.      . metoprolol  tartrate (LOPRESSOR) 25 MG tablet take 1/2 tablet by mouth twice a day 30 tablet 11  . Multiple Vitamin (MULTIVITAMIN) tablet Take 1 tablet by mouth daily.      . nitroGLYCERIN (NITROSTAT) 0.4 MG SL tablet place 1 tablet under the tongue every 5 minutes for UP TO 3 doses if needed for angina as directed by prescriber 25 tablet 3  . Omega-3 Fatty Acids (FISH OIL) 1000 MG CAPS Take 2,000 mg by mouth 2 (two) times daily.     [name] QVAR 80 MCG/ACT inhaler Inhale 2 puffs into the lungs 2 (two) times daily.   1  . SPIRIVA HANDIHALER 18 MCG inhalation capsule Place 18 mcg into inhaler and inhale 2 (two) times daily.   1  . tamsulosin (FLOMAX) 0.4 MG CAPS capsule Take 0.4 mg by mouth daily.    . VENTOLIN HFA 108 (90 BASE) MCG/ACT inhaler Inhale 2 puffs into the lungs every 4 (four) hours as needed for wheezing or shortness of breath.   1   No current facility-administered medications for this visit.   Allergies:  Review of patient's allergies indicates no known allergies.   Social History: The patient  reports that he quit smoking about 10 months ago. His smoking use included Cigarettes. He has a 22.5 pack-year smoking history. He has never used smokeless tobacco. He reports that he does not drink alcohol or use illicit drugs.  ROS:  Please see the history of present illness. Otherwise, complete review of systems is positive for NYHA class II dyspnea with typical activities.  All other systems are reviewed and negative.   Physical Exam: VS:  BP 118/75 mmHg  Pulse 65  Ht 5\' 10"  (1.778 m)  Wt 223 lb 6.4 oz (101.334 kg)  BMI 32.05 kg/m2  SpO2 93%, BMI Body mass index is 32.05 kg/(m^2).  Wt Readings from Last 3 Encounters:  08/01/15 223 lb 6.4 oz (101.334 kg)  01/07/15 232 lb (105.235 kg)  08/26/14 230 lb (104.327 kg)    General: Patient appears comfortable at rest. HEENT: Conjunctiva and lids normal, oropharynx clear. Neck: Supple, no elevated JVP or carotid bruits, no thyromegaly. Lungs:  Decreased breath sounds but clear, nonlabored breathing at rest. Cardiac: Regular rate and rhythm, no S3 or significant systolic murmur, no pericardial rub. Abdomen: Soft, nontender, bowel sounds present, no guarding or rebound. Extremities: No pitting edema, distal pulses 2+. Skin: Warm and dry. Musculoskeletal: No kyphosis. Neuropsychiatric: Alert and oriented 3, affect appropriate.  ECG: I personally reviewed the prior tracing from 07/26/2014 which showed sinus rhythm with old inferior infarct pattern.  Other Studies Reviewed Today:  Echocardiogram 05/20/2014 [facility]): Mild LVH with LVEF 60-65%, abnormal diastolic function unspecified, trace aortic regurgitation, RVSP 25 mmHg.  Cardiac catheterization 07/26/2014:  Dist LAD lesion, 30% stenosed.  Dist Cx lesion, 10% stenosed. A drug-eluting stent was placed.  Mid RCA lesion, 100% stenosed.  Mid RCA to Dist RCA lesion, 100% stenosed.  RPDA lesion, 100% stenosed.  1. Single vessel occlusive CAD with CTO of the mid RCA 2. Patent stent in the LCx 3. Good LV function  Assessment and Plan:  1. Symptomatically stable CAD. He has known occlusion of the RCA and PDA with left-to-right collaterals, otherwise patent DES in the circumflex. He has had good angina control on medical therapy. We will continue observation.  2. Hyperlipidemia, on Lipitor and omega-3 supplements. Requesting most recent lab work from Dr. Nadara Mustard.  Current medicines were reviewed with the patient today.   Orders Placed This Encounter  Procedures  . EKG 12-Lead    Disposition: FU with me in 6 months.   Signed, Satira Sark, MD, [facility] 08/01/2015 8:25 AM    [facility] [place], [place], [place] [address] Phone: [phone]; Fax: [phone]

## 2015-11-04 ENCOUNTER — Other Ambulatory Visit: Payer: Self-pay | Admitting: Cardiology

## 2015-12-24 ENCOUNTER — Encounter: Payer: Self-pay | Admitting: Cardiology

## 2015-12-27 ENCOUNTER — Other Ambulatory Visit: Payer: Self-pay | Admitting: Cardiology

## 2016-01-22 NOTE — Progress Notes (Signed)
Cardiology Office Note  Date: 01/28/2016   ID: ESSIE RUDA, DOB [birth year], MRN [id number]  PCP: Rory Percy, MD  Primary Cardiologist: Rozann Lesches, MD   Chief Complaint  Patient presents with  . Coronary Artery Disease    History of Present Illness: [name] is a 60 y.o. male last seen in May. He presents today with his wife for a follow-up visit. Reports no angina symptoms or nitroglycerin use. 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  I reviewed his medications. Cardiac regimen includes aspirin, Lipitor, Plavix, Lopressor, and as needed nitroglycerin.  He had recent lab work with Dr. Nadara Mustard as outlined below. LDL was 64. Glucose elevated, he was aware of this. We discussed limiting carbohydrates in his diet.  Cardiac catheterization from May 2016 is outlined below.  Past Medical History:  Diagnosis Date  . COPD (chronic obstructive pulmonary disease) ([place])   . Coronary atherosclerosis of native coronary artery    DES RCA 2006, DES circumflex 4/12, occluded RCA with collaterals, LVEF 45%  . Mixed hyperlipidemia   . Myocardial infarction    NSTEMI 4/12  . Seasonal allergies     Past Surgical History:  Procedure Laterality Date  . CARDIAC CATHETERIZATION N/A 07/26/2014   Procedure: Left Heart Cath and Coronary Angiography;  Surgeon: Peter M Martinique, MD;  Location: [facility];  Service: Cardiovascular;  Laterality: N/A;  . CYSTOSCOPY  01/31/2013  . Nasal reconstruction surgery      Current Outpatient Prescriptions  Medication Sig Dispense Refill  . acetaminophen (TYLENOL) 500 MG tablet Take 1,000 mg by mouth every 4 (four) hours as needed for moderate pain.     [name] aspirin 81 MG tablet Take 81 mg by mouth daily.    [name] atorvastatin (LIPITOR) 80 MG tablet take 1 tablet by mouth once daily 30 tablet 11  . ATROVENT HFA 17 MCG/ACT inhaler Inhale 2 puffs into the lungs every 6 (six) hours as needed for  wheezing.   0  . clopidogrel (PLAVIX) 75 MG tablet take 1 tablet by mouth once daily 30 tablet 6  . guaiFENesin (MUCINEX) 600 MG 12 hr tablet Take 1,200 mg by mouth 2 (two) times daily.     [name] loratadine (CLARITIN) 10 MG tablet Take 10 mg by mouth daily.      . metoprolol tartrate (LOPRESSOR) 25 MG tablet take 1/2 tablet by mouth twice a day 30 tablet 11  . Multiple Vitamin (MULTIVITAMIN) tablet Take 1 tablet by mouth daily.      . nitroGLYCERIN (NITROSTAT) 0.4 MG SL tablet place 1 tablet under the tongue every 5 minutes for UP TO 3 doses if needed for angina as directed by prescriber 25 tablet 3  . Omega-3 Fatty Acids (FISH OIL) 1000 MG CAPS Take 2,000 mg by mouth 2 (two) times daily.     [name] QVAR 80 MCG/ACT inhaler Inhale 2 puffs into the lungs 2 (two) times daily.   1  . SPIRIVA HANDIHALER 18 MCG inhalation capsule Place 18 mcg into inhaler and inhale daily.   1  . tamsulosin (FLOMAX) 0.4 MG CAPS capsule Take 0.4 mg by mouth daily.    . VENTOLIN HFA 108 (90 BASE) MCG/ACT inhaler Inhale 2 puffs into the lungs every 4 (four) hours as needed for wheezing or shortness of breath.   1   No current facility-administered medications for this visit.    Allergies:  Patient has no known allergies.  Social History: The patient  reports that he has been smoking Cigarettes.  He started smoking about 45 years ago. He has a 22.50 pack-year smoking history. He has never used smokeless tobacco. He reports that he does not drink alcohol or use drugs.   ROS:  Please see the history of present illness. Otherwise, complete review of systems is positive for arthritic knee pain.  All other systems are reviewed and negative.   Physical Exam: VS:  BP 118/70   Pulse (!) 55   Ht 5\' 10"  (1.778 m)   Wt 218 lb (98.9 kg)   BMI 31.28 kg/m , BMI Body mass index is 31.28 kg/m.  Wt Readings from Last 3 Encounters:  01/28/16 218 lb (98.9 kg)  08/01/15 223 lb 6.4 oz (101.3 kg)  01/07/15 232 lb (105.2 kg)    General:  Patient appears comfortable at rest. HEENT: Conjunctiva and lids normal, oropharynx clear. Neck: Supple, no elevated JVP or carotid bruits, no thyromegaly. Lungs: Decreased breath sounds but clear, nonlabored breathing at rest. Cardiac: Regular rate and rhythm, no S3 or significant systolic murmur, no pericardial rub. Abdomen: Soft, nontender, bowel sounds present, no guarding or rebound. Extremities: No pitting edema, distal pulses 2+. Skin: Warm and dry. Musculoskeletal: No kyphosis. Neuropsychiatric: Alert and oriented 3, affect appropriate.  ECG: I personally reviewed the tracing from 08/01/2015 which showed sinus rhythm with PACs and old inferior infarct pattern.  Recent Labwork:  October 2017: BUN 16, creatinine 1.1, potassium 4.2, AST 29, ALT 45, cholesterol 121, triglycerides 126, HDL 32, LDL 64, hemoglobin A1c 5.9  Other Studies Reviewed Today:  Echocardiogram 05/20/2014 [facility]): Mild LVH with LVEF 60-65%, abnormal diastolic function unspecified, trace aortic regurgitation, RVSP 25 mmHg.  Cardiac catheterization 07/26/2014:  Dist LAD lesion, 30% stenosed.  Dist Cx lesion, 10% stenosed. A drug-eluting stent was placed.  Mid RCA lesion, 100% stenosed.  Mid RCA to Dist RCA lesion, 100% stenosed.  RPDA lesion, 100% stenosed.  1. Single vessel occlusive CAD with CTO of the mid RCA 2. Patent stent in the LCx 3. Good LV function  Assessment and Plan:  1. Symptomatically stable CAD on medical therapy with cardiac catheterization from last year showing patent stent site within the circumflex and chronic total occlusion of the mid RCA. No changes made to current regimen.  2. Hyperlipidemia, continue on Lipitor with recent LDL 64.  3. Long-standing tobacco use. He has not been motivated to quit.  4. Elevated fasting glucose with hemoglobin A1c 5.9 per recent physical with Dr. Nadara Mustard. We did discuss limiting carbohydrates and weight loss. His triglycerides were only  126.  Current medicines were reviewed with the patient today.  Disposition: Follow-up in 6 months.  Signed, Satira Sark, MD, [facility] 01/28/2016 10:24 AM    [facility] [place], [place], [place] [address] Phone: [phone]; Fax: [phone]

## 2016-01-26 ENCOUNTER — Encounter: Payer: Self-pay | Admitting: *Deleted

## 2016-01-28 ENCOUNTER — Ambulatory Visit (INDEPENDENT_AMBULATORY_CARE_PROVIDER_SITE_OTHER): Payer: Commercial Managed Care - HMO | Admitting: Cardiology

## 2016-01-28 ENCOUNTER — Encounter: Payer: Self-pay | Admitting: Cardiology

## 2016-01-28 VITALS — BP 118/70 | HR 55 | Ht 70.0 in | Wt 218.0 lb

## 2016-01-28 DIAGNOSIS — Z720 Tobacco use: Secondary | ICD-10-CM

## 2016-01-28 DIAGNOSIS — I2510 Atherosclerotic heart disease of native coronary artery without angina pectoris: Secondary | ICD-10-CM

## 2016-01-28 DIAGNOSIS — E782 Mixed hyperlipidemia: Secondary | ICD-10-CM

## 2016-01-28 DIAGNOSIS — R7301 Impaired fasting glucose: Secondary | ICD-10-CM

## 2016-01-28 NOTE — Patient Instructions (Signed)

## 2016-07-27 ENCOUNTER — Other Ambulatory Visit: Payer: Self-pay | Admitting: Cardiology

## 2016-08-02 NOTE — Progress Notes (Signed)
Cardiology Office Note  Date: 08/03/2016   ID: Selim, Durden 1955/12/22, MRN [id number]  PCP: Rory Percy, MD  Primary Cardiologist: Rozann Lesches, MD   Chief Complaint  Patient presents with  . Coronary Artery Disease    History of Present Illness: [name] is a 61 y.o. male last seen in November 2017.; He is here with his wife today for a follow-up visit. Reports no angina symptoms at all on medical therapy since last visit. He has not had to use any nitroglycerin. 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  We went over his medications which are outlined below. Cardiac regimen includes aspirin, Plavix, Lipitor, Lopressor, and as needed nitroglycerin.  I personally reviewed his ECG today which shows sinus rhythm with PVCs and possible old inferior infarct pattern.  He continues to follow regular with Dr. Nadara Mustard, had lab work done recently.  Past Medical History:  Diagnosis Date  . COPD (chronic obstructive pulmonary disease) ([place])   . Coronary atherosclerosis of native coronary artery    DES RCA 2006, DES circumflex 4/12, occluded RCA with collaterals, LVEF 45%  . Mixed hyperlipidemia   . Myocardial infarction ([place])    NSTEMI 4/12  . Seasonal allergies     Past Surgical History:  Procedure Laterality Date  . CARDIAC CATHETERIZATION N/A 07/26/2014   Procedure: Left Heart Cath and Coronary Angiography;  Surgeon: Peter M Martinique, MD;  Location: [facility];  Service: Cardiovascular;  Laterality: N/A;  . CYSTOSCOPY  01/31/2013  . Nasal reconstruction surgery      Current Outpatient Prescriptions  Medication Sig Dispense Refill  . acetaminophen (TYLENOL) 500 MG tablet Take 1,000 mg by mouth every 4 (four) hours as needed for moderate pain.     [name] aspirin 81 MG tablet Take 81 mg by mouth daily.    [name] atorvastatin (LIPITOR) 80 MG tablet take 1 tablet by mouth once daily 30 tablet 11  . ATROVENT HFA 17 MCG/ACT  inhaler Inhale 2 puffs into the lungs every 6 (six) hours as needed for wheezing.   0  . clopidogrel (PLAVIX) 75 MG tablet take 1 tablet by mouth once daily 30 tablet 6  . guaiFENesin (MUCINEX) 600 MG 12 hr tablet Take 1,200 mg by mouth 2 (two) times daily.     [name] loratadine (CLARITIN) 10 MG tablet Take 10 mg by mouth daily.      . metoprolol tartrate (LOPRESSOR) 25 MG tablet take 1/2 tablet by mouth twice a day 30 tablet 11  . Multiple Vitamin (MULTIVITAMIN) tablet Take 1 tablet by mouth daily.      . nitroGLYCERIN (NITROSTAT) 0.4 MG SL tablet place 1 tablet under the tongue every 5 minutes for UP TO 3 doses if needed for angina as directed by prescriber 25 tablet 3  . Omega-3 Fatty Acids (FISH OIL) 1000 MG CAPS Take 2,000 mg by mouth 2 (two) times daily.     [name] QVAR 80 MCG/ACT inhaler Inhale 2 puffs into the lungs 2 (two) times daily.   1  . SPIRIVA HANDIHALER 18 MCG inhalation capsule Place 18 mcg into inhaler and inhale daily.   1  . VENTOLIN HFA 108 (90 BASE) MCG/ACT inhaler Inhale 2 puffs into the lungs every 4 (four) hours as needed for wheezing or shortness of breath.   1   No current facility-administered medications for this visit.    Allergies:  Patient has no known allergies.  Social History: The patient  reports that he quit smoking about 5 months ago. His smoking use included Cigarettes. He started smoking about 46 years ago. He has a 22.50 pack-year smoking history. He has never used smokeless tobacco. He reports that he does not drink alcohol or use drugs.   ROS:  Please see the history of present illness. Otherwise, complete review of systems is positive for easy bruising.  All other systems are reviewed and negative.   Physical Exam: VS:  BP 137/80   Pulse 64   Ht 5\' 10"  (1.778 m)   Wt 223 lb 6.4 oz (101.3 kg)   SpO2 94%   BMI 32.05 kg/m , BMI Body mass index is 32.05 kg/m.  Wt Readings from Last 3 Encounters:  08/03/16 223 lb 6.4 oz (101.3 kg)  01/28/16 218 lb (98.9  kg)  08/01/15 223 lb 6.4 oz (101.3 kg)    General: Patient appears comfortable at rest. HEENT: Conjunctiva and lids normal, oropharynx clear. Neck: Supple, no elevated JVP or carotid bruits, no thyromegaly. Lungs: Decreased breath sounds but clear, nonlabored breathing at rest. Cardiac: Regular rate and rhythm, no S3 or significant systolic murmur, no pericardial rub. Abdomen: Soft, nontender, bowel sounds present, no guarding or rebound. Extremities: No pitting edema, distal pulses 2+. Skin: Warm and dry.Few ecchymoses noted on the arms. Musculoskeletal: No kyphosis. Neuropsychiatric: Alert and oriented 3, affect appropriate.  ECG: I personally reviewed the tracing from 08/01/2015 which showed sinus rhythm with PACs and old inferior infarct pattern.  Recent Labwork:  October 2017: BUN 16, creatinine 1.1, potassium 4.2, AST 29, ALT 45, cholesterol 121, triglycerides 126, HDL 32, LDL 64, hemoglobin A1c 5.9  Other Studies Reviewed Today:  Echocardiogram 05/20/2014 [facility]): Mild LVH with LVEF 60-65%, abnormal diastolic function unspecified, trace aortic regurgitation, RVSP 25 mmHg.  Cardiac catheterization 07/26/2014:  Dist LAD lesion, 30% stenosed.  Dist Cx lesion, 10% stenosed. A drug-eluting stent was placed.  Mid RCA lesion, 100% stenosed.  Mid RCA to Dist RCA lesion, 100% stenosed.  RPDA lesion, 100% stenosed.  1. Single vessel occlusive CAD with CTO of the mid RCA 2. Patent stent in the LCx 3. Good LV function  Assessment and Plan:  1. CAD with chronic total occlusion of the mid RCA and patent DES within the circumflex by angiography in 2016. He is not reporting any angina symptoms on medical therapy, we will continue with observation.  2. Hyperlipidemia, remains on Lipitor and follows with Dr. Nadara Mustard.  3. Tobacco abuse, has not been motivated to quit. We have discussed this over time.  4. COPD, no recent exacerbations or hospitalizations. Continues on  MDIs.  Current medicines were reviewed with the patient today.   Orders Placed This Encounter  Procedures  . EKG 12-Lead    Disposition: Follow-up in 6 months.  Signed, Satira Sark, MD, [facility] 08/03/2016 3:31 PM     [facility] [address], [place], [place] [address] Phone: [phone]; Fax: [phone]

## 2016-08-03 ENCOUNTER — Encounter: Payer: Self-pay | Admitting: Cardiology

## 2016-08-03 ENCOUNTER — Ambulatory Visit (INDEPENDENT_AMBULATORY_CARE_PROVIDER_SITE_OTHER): Payer: Commercial Managed Care - HMO | Admitting: Cardiology

## 2016-08-03 ENCOUNTER — Ambulatory Visit: Payer: Commercial Managed Care - HMO | Admitting: Cardiology

## 2016-08-03 VITALS — BP 137/80 | HR 64 | Ht 70.0 in | Wt 223.4 lb

## 2016-08-03 DIAGNOSIS — I25119 Atherosclerotic heart disease of native coronary artery with unspecified angina pectoris: Secondary | ICD-10-CM | POA: Diagnosis not present

## 2016-08-03 DIAGNOSIS — E782 Mixed hyperlipidemia: Secondary | ICD-10-CM

## 2016-08-03 DIAGNOSIS — Z720 Tobacco use: Secondary | ICD-10-CM | POA: Diagnosis not present

## 2016-08-03 DIAGNOSIS — J449 Chronic obstructive pulmonary disease, unspecified: Secondary | ICD-10-CM | POA: Diagnosis not present

## 2016-08-03 NOTE — Patient Instructions (Signed)

## 2016-11-07 ENCOUNTER — Other Ambulatory Visit: Payer: Self-pay | Admitting: Cardiology

## 2017-01-28 ENCOUNTER — Encounter: Payer: Self-pay | Admitting: *Deleted

## 2017-01-28 NOTE — Progress Notes (Signed)
Cardiology Office Note  Date: 01/31/2017   ID: Bernarr, Longsworth 09-Jun-1955, MRN [id number]  PCP: Rory Percy, MD  Primary Cardiologist: Rozann Lesches, MD   Chief Complaint  Patient presents with  . ER follow-up    History of Present Illness: [name] is a 61 y.o. male last seen in May.  I reviewed interval records.  He was just recently seen at [facility] ER with chest discomfort and newly documented rapid atrial fibrillation.  In reviewing the ER summary, plan was for patient to be transferred to [facility] although bed was not immediately available.  It appears that he was ultimately discharged following discussion with [facility] cardiology and medication adjustments for heart rate control, also change from Plavix to Xarelto for stroke prophylaxis.  An office visit was scheduled with me.  He presents today with his wife for follow-up.  He states that he feels back to baseline and that his heart rate has been in the 60s-70s as usual when he checks it at home.  I suspect that he converted back to sinus rhythm fairly soon after ER evaluation.  He has had no further chest pain.  I personally reviewed recent serial ECGs from November 14. Atrial fibrillation present throughout with possible old inferior infarct pattern, nonspecific T wave changes.  Follow-up ECG today shows normal sinus rhythm.  CHADSVASC score is 1.  We did discuss his risk of stroke.  For now we have decided to keep him on Xarelto.  I also agree with the increase in Lopressor that was made.  Discussed the natural history of atrial fibrillation, other treatment options including antiarrhythmic therapy and even potentially assessment for ablation.  Past Medical History:  Diagnosis Date  . COPD (chronic obstructive pulmonary disease) ([place])   . Coronary atherosclerosis of native coronary artery    DES RCA 2006, DES circumflex 4/12, occluded RCA with collaterals, LVEF 45%  . Mixed  hyperlipidemia   . Myocardial infarction ([place])    NSTEMI 4/12  . Seasonal allergies     Past Surgical History:  Procedure Laterality Date  . CYSTOSCOPY  01/31/2013  . Left Heart Cath and Coronary Angiography N/A 07/26/2014   Performed by Martinique, Peter M, MD at [place] [facility]  . Nasal reconstruction surgery      Current Outpatient Medications  Medication Sig Dispense Refill  . acetaminophen (TYLENOL) 500 MG tablet Take 1,000 mg by mouth every 4 (four) hours as needed for moderate pain.     [name] aspirin 81 MG tablet Take 81 mg by mouth daily.    [name] atorvastatin (LIPITOR) 80 MG tablet take 1 tablet by mouth once daily 30 tablet 11  . ATROVENT HFA 17 MCG/ACT inhaler Inhale 2 puffs into the lungs every 6 (six) hours as needed for wheezing.   0  . guaiFENesin (MUCINEX) 600 MG 12 hr tablet Take 1,200 mg by mouth 2 (two) times daily.     [name] loratadine (CLARITIN) 10 MG tablet Take 10 mg by mouth daily.      . metoprolol tartrate (LOPRESSOR) 25 MG tablet Take 1 tablet (25 mg total) 2 (two) times daily by mouth. 60 tablet 3  . Multiple Vitamin (MULTIVITAMIN) tablet Take 1 tablet by mouth daily.      . nitroGLYCERIN (NITROSTAT) 0.4 MG SL tablet place 1 tablet under the tongue every 5 minutes for UP TO 3 doses if needed for angina as directed by prescriber 25 tablet 3  .  Omega-3 Fatty Acids (FISH OIL) 1000 MG CAPS Take 2,000 mg by mouth 2 (two) times daily.     [name] QVAR 80 MCG/ACT inhaler Inhale 2 puffs into the lungs 2 (two) times daily.   1  . rivaroxaban (XARELTO) 10 MG TABS tablet Take 1 tablet (10 mg total) daily by mouth. 30 tablet 3  . SPIRIVA HANDIHALER 18 MCG inhalation capsule Place 18 mcg into inhaler and inhale daily.   1  . VENTOLIN HFA 108 (90 BASE) MCG/ACT inhaler Inhale 2 puffs into the lungs every 4 (four) hours as needed for wheezing or shortness of breath.   1   No current facility-administered medications for this visit.    Allergies:  Patient has no known allergies.   Social  History: The patient  reports that he quit smoking about a year ago. His smoking use included cigarettes. He started smoking about 46 years ago. He has a 22.50 pack-year smoking history. he has never used smokeless tobacco. He reports that he does not drink alcohol or use drugs.   ROS:  Please see the history of present illness. Otherwise, complete review of systems is positive for none.  All other systems are reviewed and negative.   Physical Exam: VS:  BP 122/80   Pulse 65   Ht 5\' 10"  (1.778 m)   Wt 219 lb (99.3 kg)   SpO2 98%   BMI 31.42 kg/m , BMI Body mass index is 31.42 kg/m.  Wt Readings from Last 3 Encounters:  01/31/17 219 lb (99.3 kg)  08/03/16 223 lb 6.4 oz (101.3 kg)  01/28/16 218 lb (98.9 kg)    General: Patient appears comfortable at rest. HEENT: Conjunctiva and lids normal, oropharynx clear. Neck: Supple, no elevated JVP or carotid bruits, no thyromegaly. Lungs: Decreased breath sounds without wheezing, nonlabored breathing at rest. Cardiac: Regular rate and rhythm, no S3 or significant systolic murmur, no pericardial rub. Abdomen: Soft, nontender, bowel sounds present, no guarding or rebound. Extremities: No pitting edema, distal pulses 2+. Skin: Warm and dry. Musculoskeletal: No kyphosis. Neuropsychiatric: Alert and oriented x3, affect grossly appropriate.  ECG: I personally reviewed the tracing from 08/03/2016 which showed sinus rhythm with PVCs and old inferior infarct pattern.   Recent Labwork:  October 2017: BUN 16, creatinine 1.1, potassium 4.2, AST 29, ALT 45, cholesterol 121, triglycerides 126, HDL 32, LDL 64, hemoglobin A1c 5.21 January 2017: BUN 14, creatinine 0.66, potassium 3.9, troponin T less than 0.01, NT-proBNP 74, hemoglobin 18.6, platelets 384, UDS positive for cannabinoids and opiates  Other Studies Reviewed Today:  Echocardiogram 05/20/2014 [facility]): Mild LVH with LVEF 60-65%, abnormal diastolic function unspecified, trace aortic  regurgitation, RVSP 25 mmHg.  Cardiac catheterization 07/26/2014:  Dist LAD lesion, 30% stenosed.  Dist Cx lesion, 10% stenosed. A drug-eluting stent was placed.  Mid RCA lesion, 100% stenosed.  Mid RCA to Dist RCA lesion, 100% stenosed.  RPDA lesion, 100% stenosed.  1. Single vessel occlusive CAD with CTO of the mid RCA 2. Patent stent in the LCx 3. Good LV function  Chest x-ray 01/26/2017: Vascular congestion with minimal left basilar atelectasis.  Assessment and Plan:  1.  Recently documented episode of atrial fibrillation with RVR, converted spontaneously to sinus rhythm, follow-up ECG reviewed today.  Symptomatically, he is back to baseline.  Suspect he may have paroxysmal atrial fibrillation, but he does not report any prior feeling of palpitations.  CHADSVASC score is 1.  For now we will continue Xarelto and also increased dose Lopressor.  If  he manifests recurring symptoms, can always consider referral to EP to discuss antiarrhythmic therapy versus ablation. Tikosyn might be an option, would be concerned about amiodarone with his COPD, and he would not be a candidate for flecainide with ischemic heart disease.  2.  CAD with occluded mid RCA and patent DES within the circumflex.  He reports no angina symptoms with typical activity, troponin T levels were negative for ACS with recent atrial fibrillation event.  She is normal today.  3.  Tobacco abuse.  Cessation has been discussed, he has not been able to quit.  4.  COPD.  Continues on MDI and nebulizer treatments.  Current medicines were reviewed with the patient today.   Orders Placed This Encounter  Procedures  . CBC  . Basic Metabolic Panel (BMET)  . EKG 12-Lead    Disposition: Follow-up in 3 months with CBC and BMET.  Signed, Satira Sark, MD, [facility] 01/31/2017 12:11 PM    [facility] [place], [place], [place] [address] Phone: [phone]; Fax: [phone]

## 2017-01-31 ENCOUNTER — Ambulatory Visit (INDEPENDENT_AMBULATORY_CARE_PROVIDER_SITE_OTHER): Payer: 59 | Admitting: Cardiology

## 2017-01-31 ENCOUNTER — Encounter: Payer: Self-pay | Admitting: Cardiology

## 2017-01-31 VITALS — BP 122/80 | HR 65 | Ht 70.0 in | Wt 219.0 lb

## 2017-01-31 DIAGNOSIS — Z720 Tobacco use: Secondary | ICD-10-CM | POA: Diagnosis not present

## 2017-01-31 DIAGNOSIS — I480 Paroxysmal atrial fibrillation: Secondary | ICD-10-CM

## 2017-01-31 DIAGNOSIS — J449 Chronic obstructive pulmonary disease, unspecified: Secondary | ICD-10-CM | POA: Diagnosis not present

## 2017-01-31 DIAGNOSIS — I25119 Atherosclerotic heart disease of native coronary artery with unspecified angina pectoris: Secondary | ICD-10-CM

## 2017-01-31 MED ORDER — METOPROLOL TARTRATE 25 MG PO TABS: 25.0000 mg | ORAL_TABLET | Freq: Two times a day (BID) | ORAL | 3 refills | Status: DC

## 2017-01-31 MED ORDER — RIVAROXABAN 10 MG PO TABS: 10.0000 mg | ORAL_TABLET | Freq: Every day | ORAL | 3 refills | Status: DC

## 2017-01-31 NOTE — Patient Instructions (Signed)
Medication Instructions:  Your physician recommends that you continue on your current medications as directed. Please refer to the Current Medication list given to you today.  Labwork: CBC/BMET Orders given today  Testing/Procedures: NONE  Follow-Up: Your physician recommends that you schedule a follow-up appointment in: 3 MONTHS WITH DR. MCDOWELL  Any Other Special Instructions Will Be Listed Below (If Applicable).  If you need a refill on your cardiac medications before your next appointment, please call your pharmacy.

## 2017-02-08 ENCOUNTER — Telehealth: Payer: Self-pay | Admitting: Cardiology

## 2017-02-08 NOTE — Telephone Encounter (Signed)
Unclear in previous notes and doesn't mention pt being on both ASA and Xarelto. Was previously on plavix and asa and was changed to Xarelto after 01/26/17 admission. Will confirm with provider if pt should be taking Xarelto and asa or just Xarelto

## 2017-02-08 NOTE — Telephone Encounter (Signed)
Would go with Xarelto alone at this point.

## 2017-02-08 NOTE — Telephone Encounter (Signed)
Pt made aware - updated medication list  

## 2017-02-08 NOTE — Telephone Encounter (Signed)
Has question about if patient is suppose to be taking aspirin with xalrelto

## 2017-04-01 ENCOUNTER — Other Ambulatory Visit: Payer: Self-pay | Admitting: *Deleted

## 2017-04-01 MED ORDER — NITROGLYCERIN 0.4 MG SL SUBL: SUBLINGUAL_TABLET | SUBLINGUAL | 3 refills | Status: DC

## 2017-04-22 NOTE — Progress Notes (Signed)
Cardiology Office Note  Date: 04/25/2017   ID: Edward, Patton 04/04/1955, MRN [id number]  PCP: Rory Percy, MD  Primary Cardiologist: Rozann Lesches, MD   Chief Complaint  Patient presents with  . PAF    History of Present Illness: [name] is a 62 y.o. male last seen in November 2018.  He presents for a routine follow-up visit.  He does not report any chest pain or palpitations since last assessment.  He denies any significant bleeding problems and states that he had recent lab work through Dr. Lyman Speller office which we are requesting.  He continues on Xarelto, Lopressor, Lipitor, and has as needed nitroglycerin available.  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  Thinks he will probably retire within the next few years.  Past Medical History:  Diagnosis Date  . COPD (chronic obstructive pulmonary disease) ([place])   . Coronary atherosclerosis of native coronary artery    DES RCA 2006, DES circumflex 4/12, occluded RCA with collaterals, LVEF 45%  . Mixed hyperlipidemia   . Myocardial infarction ([place])    NSTEMI 4/12  . Seasonal allergies     Past Surgical History:  Procedure Laterality Date  . CARDIAC CATHETERIZATION N/A 07/26/2014   Procedure: Left Heart Cath and Coronary Angiography;  Surgeon: Peter M Martinique, MD;  Location: [place] [facility];  Service: Cardiovascular;  Laterality: N/A;  . CYSTOSCOPY  01/31/2013  . Nasal reconstruction surgery      Current Outpatient Medications  Medication Sig Dispense Refill  . acetaminophen (TYLENOL) 500 MG tablet Take 1,000 mg by mouth every 4 (four) hours as needed for moderate pain.     [name] atorvastatin (LIPITOR) 80 MG tablet take 1 tablet by mouth once daily 30 tablet 11  . ATROVENT HFA 17 MCG/ACT inhaler Inhale 2 puffs into the lungs every 6 (six) hours as needed for wheezing.   0  . guaiFENesin (MUCINEX) 600 MG 12 hr tablet Take 1,200 mg by mouth 2 (two)  times daily.     [name] loratadine (CLARITIN) 10 MG tablet Take 10 mg by mouth daily.      . metoprolol tartrate (LOPRESSOR) 25 MG tablet Take 1 tablet (25 mg total) 2 (two) times daily by mouth. 60 tablet 3  . Multiple Vitamin (MULTIVITAMIN) tablet Take 1 tablet by mouth daily.      . nitroGLYCERIN (NITROSTAT) 0.4 MG SL tablet place 1 tablet under the tongue every 5 minutes for UP TO 3 doses if needed for angina as directed by prescriber 25 tablet 3  . Omega-3 Fatty Acids (FISH OIL) 1000 MG CAPS Take 2,000 mg by mouth 2 (two) times daily.     [name] QVAR 80 MCG/ACT inhaler Inhale 2 puffs into the lungs 2 (two) times daily.   1  . rivaroxaban (XARELTO) 10 MG TABS tablet Take 1 tablet (10 mg total) daily by mouth. 30 tablet 3  . SPIRIVA HANDIHALER 18 MCG inhalation capsule Place 18 mcg into inhaler and inhale daily.   1  . VENTOLIN HFA 108 (90 BASE) MCG/ACT inhaler Inhale 2 puffs into the lungs every 4 (four) hours as needed for wheezing or shortness of breath.   1   No current facility-administered medications for this visit.    Allergies:  Patient has no known allergies.   Social History: The patient  reports that he quit smoking about 14 months ago. His smoking use included cigarettes. He started smoking about  47 years ago. He has a 22.50 pack-year smoking history. he has never used smokeless tobacco. He reports that he does not drink alcohol or use drugs.   ROS:  Please see the history of present illness. Otherwise, complete review of systems is positive for none.  All other systems are reviewed and negative.   Physical Exam: VS:  BP 112/78   Pulse 60   Ht 5\' 10"  (1.778 m)   Wt 224 lb (101.6 kg)   SpO2 98%   BMI 32.14 kg/m , BMI Body mass index is 32.14 kg/m.  Wt Readings from Last 3 Encounters:  04/25/17 224 lb (101.6 kg)  01/31/17 219 lb (99.3 kg)  08/03/16 223 lb 6.4 oz (101.3 kg)    General: Patient appears comfortable at rest. HEENT: Conjunctiva and lids normal, oropharynx  clear. Neck: Supple, no elevated JVP or carotid bruits, no thyromegaly. Lungs: Clear to auscultation, nonlabored breathing at rest. Cardiac: Regular rate and rhythm, no S3 or significant systolic murmur, no pericardial rub. Abdomen: Soft, nontender, bowel sounds present. Extremities: No pitting edema, distal pulses 2+. Skin: Warm and dry. Musculoskeletal: No kyphosis. Neuropsychiatric: Alert and oriented x3, affect grossly appropriate.  ECG: I personally reviewed the tracing from 01/31/2017 which showed sinus rhythm.  Recent Labwork:  November 2018: BUN 14, creatinine 0.66, potassium 3.9, troponin T less than 0.01, NT-proBNP 74, hemoglobin 18.6, platelets 384, UDS positive for cannabinoids and opiates  Other Studies Reviewed Today:  Echocardiogram 05/20/2014 [facility]): Mild LVH with LVEF 60-65%, abnormal diastolic function unspecified, trace aortic regurgitation, RVSP 25 mmHg.  Cardiac catheterization 07/26/2014:  Dist LAD lesion, 30% stenosed.  Dist Cx lesion, 10% stenosed. A drug-eluting stent was placed.  Mid RCA lesion, 100% stenosed.  Mid RCA to Dist RCA lesion, 100% stenosed.  RPDA lesion, 100% stenosed.  1. Single vessel occlusive CAD with CTO of the mid RCA 2. Patent stent in the LCx 3. Good LV function  Assessment and Plan:  1.  Paroxysmal atrial fibrillation with CHADSVASC score of 1.  He remains symptomatically stable and we will plan to continue with Xarelto and Lopressor for now.  Requesting recent lab work from Dr. Nadara Mustard.  2.  CAD with DES to the circumflex and occluded mid RCA.  He remained stable without angina symptoms on medical therapy.  3.  Tobacco abuse.  We have discussed smoking cessation over time.  4.  COPD, followed by Dr. Nadara Mustard.  Current medicines were reviewed with the patient today.   Disposition: Follow-up in 6 months.  Signed, Satira Sark, MD, [facility] 04/25/2017 10:01 AM    [facility] at [place], [place], [place] [address] Phone: [phone]; Fax: [phone]

## 2017-04-25 ENCOUNTER — Encounter: Payer: Self-pay | Admitting: *Deleted

## 2017-04-25 ENCOUNTER — Ambulatory Visit: Payer: 59 | Admitting: Cardiology

## 2017-04-25 ENCOUNTER — Encounter: Payer: Self-pay | Admitting: Cardiology

## 2017-04-25 VITALS — BP 112/78 | HR 60 | Ht 70.0 in | Wt 224.0 lb

## 2017-04-25 DIAGNOSIS — I25119 Atherosclerotic heart disease of native coronary artery with unspecified angina pectoris: Secondary | ICD-10-CM | POA: Diagnosis not present

## 2017-04-25 DIAGNOSIS — J449 Chronic obstructive pulmonary disease, unspecified: Secondary | ICD-10-CM

## 2017-04-25 DIAGNOSIS — Z720 Tobacco use: Secondary | ICD-10-CM | POA: Diagnosis not present

## 2017-04-25 DIAGNOSIS — I480 Paroxysmal atrial fibrillation: Secondary | ICD-10-CM | POA: Diagnosis not present

## 2017-04-25 NOTE — Patient Instructions (Signed)

## 2017-05-30 ENCOUNTER — Other Ambulatory Visit: Payer: Self-pay | Admitting: *Deleted

## 2017-05-30 MED ORDER — RIVAROXABAN 10 MG PO TABS: 10.0000 mg | ORAL_TABLET | Freq: Every day | ORAL | 6 refills | Status: DC

## 2017-06-01 ENCOUNTER — Telehealth: Payer: Self-pay | Admitting: *Deleted

## 2017-06-01 ENCOUNTER — Encounter: Payer: Self-pay | Admitting: Student

## 2017-06-01 ENCOUNTER — Other Ambulatory Visit (HOSPITAL_COMMUNITY)
Admission: RE | Admit: 2017-06-01 | Discharge: 2017-06-01 | Disposition: A | Discharge status: Home / Self Care | Payer: 59 | Source: Ambulatory Visit | Attending: Student | Admitting: Student

## 2017-06-01 ENCOUNTER — Ambulatory Visit: Payer: 59 | Admitting: Student

## 2017-06-01 VITALS — BP 126/88 | HR 74 | Ht 70.0 in | Wt 218.0 lb

## 2017-06-01 DIAGNOSIS — I25118 Atherosclerotic heart disease of native coronary artery with other forms of angina pectoris: Secondary | ICD-10-CM

## 2017-06-01 DIAGNOSIS — E782 Mixed hyperlipidemia: Secondary | ICD-10-CM | POA: Diagnosis not present

## 2017-06-01 DIAGNOSIS — I480 Paroxysmal atrial fibrillation: Secondary | ICD-10-CM | POA: Diagnosis present

## 2017-06-01 DIAGNOSIS — Z720 Tobacco use: Secondary | ICD-10-CM | POA: Diagnosis not present

## 2017-06-01 DIAGNOSIS — Z7901 Long term (current) use of anticoagulants: Secondary | ICD-10-CM

## 2017-06-01 DIAGNOSIS — R0789 Other chest pain: Secondary | ICD-10-CM | POA: Diagnosis not present

## 2017-06-01 DIAGNOSIS — I10 Essential (primary) hypertension: Secondary | ICD-10-CM

## 2017-06-01 LAB — BASIC METABOLIC PANEL
Anion gap: 11 (ref 5–15)
BUN: 16 mg/dL (ref 6–20)
CALCIUM: 9.1 mg/dL (ref 8.9–10.3)
CO2: 22 mmol/L (ref 22–32)
CREATININE: 0.79 mg/dL (ref 0.61–1.24)
Chloride: 106 mmol/L (ref 101–111)
GFR calc non Af Amer: >60 mL/min (ref >60)
Glucose, Bld: 108 mg/dL — ABNORMAL HIGH (ref 65–99)
Potassium: 4.1 mmol/L (ref 3.5–5.1)
SODIUM: 139 mmol/L (ref 135–145)

## 2017-06-01 LAB — D-DIMER, QUANTITATIVE (NOT AT ARMC): D DIMER QUANT: 0.45 ug{FEU}/mL (ref 0.00–0.50)

## 2017-06-01 LAB — CBC WITH DIFFERENTIAL/PLATELET
BASOS ABS: 0 10*3/uL (ref 0.0–0.1)
BASOS PCT: 0 %
EOS ABS: 0.3 10*3/uL (ref 0.0–0.7)
EOS PCT: 2 %
HCT: 53.7 % — ABNORMAL HIGH (ref 39.0–52.0)
Hemoglobin: 18.1 g/dL — ABNORMAL HIGH (ref 13.0–17.0)
Lymphocytes Relative: 26 %
Lymphs Abs: 3.8 10*3/uL (ref 0.7–4.0)
MCH: 31.8 pg (ref 26.0–34.0)
MCHC: 33.7 g/dL (ref 30.0–36.0)
MCV: 94.2 fL (ref 78.0–100.0)
Monocytes Absolute: 1.1 10*3/uL — ABNORMAL HIGH (ref 0.1–1.0)
Monocytes Relative: 8 %
Neutro Abs: 9.2 10*3/uL — ABNORMAL HIGH (ref 1.7–7.7)
Neutrophils Relative %: 64 %
PLATELETS: 367 10*3/uL (ref 150–400)
RBC: 5.7 MIL/uL (ref 4.22–5.81)
RDW: 14.4 % (ref 11.5–15.5)
WBC: 14.5 10*3/uL — ABNORMAL HIGH (ref 4.0–10.5)

## 2017-06-01 LAB — TSH: TSH: 1.401 u[IU]/mL (ref 0.350–4.500)

## 2017-06-01 MED ORDER — RIVAROXABAN 20 MG PO TABS: 20.0000 mg | ORAL_TABLET | Freq: Every day | ORAL | 11 refills | Status: DC

## 2017-06-01 NOTE — Progress Notes (Signed)
Cardiology Office Note    Date:  06/01/2017   ID:  Joran, Kallal [birth year], MRN [id number]  PCP:  Rory Percy, MD  Cardiologist: Rozann Lesches, MD    Chief Complaint  Patient presents with  . Follow-up    recent palpitations    History of Present Illness:    [name] is a 62 y.o. male with past medical history of CAD (s/p DES to RCA in 2006, DES to LCx in 2012 with CTO of RCA noted at that time), PAF (on Xarelto), HTN, HLD, and continued tobacco use who presents to the office today for evaluation of palpitations.  He was recently evaluated by Dr. Domenic Polite on 04/25/2017 and denied any recent chest pain or dyspnea on exertion at that time. He was continued on his current medication regimen including Xarelto for anticoagulation in the setting of newly-diagnosed paroxysmal atrial fibrillation.  He called the office on 06/01/2017 and reported his heart rate has been elevated in the 120's-140's with associated palpitations, therefore a close follow-up visit was arranged.  In talking with the patient today, he reports initially having an expisode of chest discomfort which was present 3 days ago and lasted the entire day, worse with bending over or turning from side to side. No association with exertion and no dyspnea or palpitations at that time. His symptoms spontaneously resolved towards the end of the day and he was in his usual state of health until last night at which time he developed tachy-palpitations. He reports feeling like his heart was beating out of his chest and says his heart rate was ranging from the 130's to 150's on his pulse oximeter. He had just taken his evening Lopressor dose and did not take any additional medication. He was able to go to sleep around 10:00 and says when he awoke this morning his heart rate was in the low 100's. His symptoms have since resolved and he reports feeling back to baseline at this time. No excessive caffeine or alcohol intake.     He denies any recent orthopnea, PND, or lower extremity edema. Breathing is at baseline today. No recent calf pain. He does mention during his recent ED visit at [facility] that he was told he had a clot in his leg but that no doppler studies were performed at that time for an official diagnosis.    Past Medical History:  Diagnosis Date  . COPD (chronic obstructive pulmonary disease) ([place])   . Coronary atherosclerosis of native coronary artery    a. s/p DES to RCA in 2006 b. DES to LCx in 2012 with CTO of RCA noted at that time  . Mixed hyperlipidemia   . Myocardial infarction ([place])    NSTEMI 4/12  . Seasonal allergies     Past Surgical History:  Procedure Laterality Date  . CARDIAC CATHETERIZATION N/A 07/26/2014   Procedure: Left Heart Cath and Coronary Angiography;  Surgeon: Peter M Martinique, MD;  Location: [facility];  Service: Cardiovascular;  Laterality: N/A;  . CYSTOSCOPY  01/31/2013  . Nasal reconstruction surgery      Current Medications: Outpatient Medications Prior to Visit  Medication Sig Dispense Refill  . acetaminophen (TYLENOL) 500 MG tablet Take 1,000 mg by mouth every 4 (four) hours as needed for moderate pain.     [name] atorvastatin (LIPITOR) 80 MG tablet take 1 tablet by mouth once daily 30 tablet 11  . ATROVENT HFA 17 MCG/ACT inhaler Inhale 2 puffs into the lungs  every 6 (six) hours as needed for wheezing.   0  . guaiFENesin (MUCINEX) 600 MG 12 hr tablet Take 1,200 mg by mouth 2 (two) times daily.     [name] loratadine (CLARITIN) 10 MG tablet Take 10 mg by mouth daily.      . metoprolol tartrate (LOPRESSOR) 25 MG tablet Take 1 tablet (25 mg total) 2 (two) times daily by mouth. 60 tablet 3  . Multiple Vitamin (MULTIVITAMIN) tablet Take 1 tablet by mouth daily.      . nitroGLYCERIN (NITROSTAT) 0.4 MG SL tablet place 1 tablet under the tongue every 5 minutes for UP TO 3 doses if needed for angina as directed by prescriber 25 tablet 3  . Omega-3 Fatty Acids (FISH  OIL) 1000 MG CAPS Take 2,000 mg by mouth 2 (two) times daily.     [name] QVAR 80 MCG/ACT inhaler Inhale 2 puffs into the lungs 2 (two) times daily.   1  . SPIRIVA HANDIHALER 18 MCG inhalation capsule Place 18 mcg into inhaler and inhale daily.   1  . VENTOLIN HFA 108 (90 BASE) MCG/ACT inhaler Inhale 2 puffs into the lungs every 4 (four) hours as needed for wheezing or shortness of breath.   1  . rivaroxaban (XARELTO) 10 MG TABS tablet Take 1 tablet (10 mg total) by mouth daily. 30 tablet 6   No facility-administered medications prior to visit.      Allergies:   Patient has no known allergies.   Social History   Socioeconomic History  . Marital status: Married    Spouse name: None  . Number of children: None  . Years of education: None  . Highest education level: None  Social Needs  . Financial resource strain: None  . Food insecurity - worry: None  . Food insecurity - inability: None  . Transportation needs - medical: None  . Transportation needs - non-medical: None  Occupational History  . Occupation: Agricultural consultant  Tobacco Use  . Smoking status: Former Smoker    Packs/day: 0.75    Years: 30.00    Pack years: 22.50    Types: Cigarettes    Start date: 03/12/1970    Last attempt to quit: 02/13/2016    Years since quitting: 1.2  . Smokeless tobacco: Never Used  Substance and Sexual Activity  . Alcohol use: No    Alcohol/week: 0.0 oz  . Drug use: No  . Sexual activity: None  Other Topics Concern  . None  Social History Narrative  . None     Family History:  The patient's family history includes Coronary artery disease in his unknown relative.   Review of Systems:   Please see the history of present illness.     General:  No chills, fever, night sweats or weight changes.  Cardiovascular:  No dyspnea on exertion, edema, orthopnea, paroxysmal nocturnal dyspnea. Positive for palpitations and chest pain.  Dermatological: No rash, lesions/masses Respiratory: No cough,  dyspnea Urologic: No hematuria, dysuria Abdominal:   No nausea, vomiting, diarrhea, bright red blood per rectum, melena, or hematemesis Neurologic:  No visual changes, wkns, changes in mental status.  All other systems reviewed and are otherwise negative except as noted above.   Physical Exam:    VS:  BP 126/88   Pulse 74   Ht 5\' 10"  (1.778 m)   Wt 218 lb (98.9 kg)   SpO2 95%   BMI 31.28 kg/m    General: Well developed, well nourished Caucasian male appearing in no acute  distress. Head: Normocephalic, atraumatic, sclera non-icteric, no xanthomas, nares are without discharge.  Neck: No carotid bruits. JVD not elevated.  Lungs: Respirations regular and unlabored, without wheezes or rales.  Heart: Regular rate and rhythm. No S3 or S4.  No murmur, no rubs, or gallops appreciated. Abdomen: Soft, non-tender, non-distended with normoactive bowel sounds. No hepatomegaly. No rebound/guarding. No obvious abdominal masses. Msk:  Strength and tone appear normal for age. No joint deformities or effusions. Extremities: No clubbing or cyanosis. No lower extremity edema.  Distal pedal pulses are 2+ bilaterally. Neuro: Alert and oriented X 3. Moves all extremities spontaneously. No focal deficits noted. Psych:  Responds to questions appropriately with a normal affect. Skin: No rashes or lesions noted  Wt Readings from Last 3 Encounters:  06/01/17 218 lb (98.9 kg)  04/25/17 224 lb (101.6 kg)  01/31/17 219 lb (99.3 kg)     Studies/Labs Reviewed:   EKG:  EKG is ordered today. The ekg ordered today demonstrates NSR, HR 74, with isolated TWI along Lead III (similar to prior tracings).   Recent Labs: 06/01/2017: BUN 16; Creatinine, Ser 0.79; Hemoglobin 18.1; Platelets 367; Potassium 4.1; Sodium 139; TSH 1.401   Lipid Panel No results found for: CHOL, TRIG, HDL, CHOLHDL, VLDL, LDLCALC, LDLDIRECT  Additional studies/ records that were reviewed today include:   Cardiac Catheterization:  07/2014  Dist LAD lesion, 30% stenosed.  Dist Cx lesion, 10% stenosed. A drug-eluting stent was placed.  Mid RCA lesion, 100% stenosed.  Mid RCA to Dist RCA lesion, 100% stenosed.  RPDA lesion, 100% stenosed.   1. Single vessel occlusive CAD with CTO of the mid RCA 2. Patent stent in the LCx 3. Good LV function  Recommendations: Continue medical therapy.  Assessment:    1. PAF (paroxysmal atrial fibrillation) ([place])   2. Current use of long term anticoagulation   3. SOB (shortness of breath)   4. Coronary artery disease involving native coronary artery of native heart with other form of angina pectoris ([place])   5. Essential hypertension, benign   6. Mixed hyperlipidemia   7. Tobacco abuse      Plan:   In order of problems listed above:  1. Paroxysmal Atrial Fibrillation/Use of Long-Term Anticoagulation - recently diagnosed with PAF at [facility] and was started on Xarelto at that time and Lopressor dosing was further titrated for improved rate-control. Had been doing well until last night when he developed recurrent tachy-palpitations with HR ranging from the 100's to 150's by his report. Symptoms improved and he is now back in NSR. This was his first episode since initial diagnosis.  - will recheck labs including CBC, BMET, and TSH. Can consider an event monitor to assess AF burden pending lab results. He is currently taking Lopressor 25mg  BID. I advised him he can take an additional tablet as needed if symptoms were to reoccur.  - This patients CHA2DS2-VASc Score and unadjusted Ischemic Stroke Rate (% per year) is equal to 2.2 % stroke rate/year from a score of 2 (CHF, HTN). In reviewing, medications it is unclear why he is only on Xarelto 10mg  daily. Renal function has been WNL by most recent labs. Will increase to appropriate dosing of 20mg  daily for atrial fibrillation. He has noted swelling along his lower extremities and says he was told he had a "clot in his legs"  while at [facility] but dopplers were never obtained by his report and no records are currently available for review. Will re-check d-dimer today. If elevated,  would need lower extremity dopplers as a diagnosis of DVT would influence his Xarelto dosing.   2. CAD - s/p DES to RCA in 2006, DES to LCx in 2012 with CTO of RCA noted at that time. He does note a recent episode of chest pain which lasted throughout the day and was worse with positional changes. No association with exertion and not similar to his prior angina.   - EKG today shows no acute ischemic changes. Will check d-dimer as outlined above to rule out DVT/PE as he has been on subtherapeutic dosing of Xarelto. If labs are within normal limits and he has recurrent symptoms, would have a low-threshold to repeat ischemic evaluation with a NST. Will arrange for close follow-up within the next few weeks to reassess. - continue BB and statin therapy. No ASA secondary to the need for Xarelto.    3. HTN - BP is well-controlled at 126/88 during today's visit.  - continue current medication regimen.   4. HLD - remains on Atorvastatin 80mg  daily.   5. Tobacco Use - cessation advised.   Medication Adjustments/Labs and Tests Ordered: Current medicines are reviewed at length with the patient today.  Concerns regarding medicines are outlined above.  Medication changes, Labs and Tests ordered today are listed in the Patient Instructions below. Patient Instructions  Medication Instructions:  Your physician has recommended you make the following change in your medication:  Increase Xarelto to 20 mg Daily   Labwork: Your physician recommends that you return for lab work today.   Testing/Procedures: NONE   Follow-Up: Your physician recommends that you schedule a follow-up appointment in: 2 Weeks   Any Other Special Instructions Will Be Listed Below (If Applicable).  If you need a refill on your cardiac medications before your next  appointment, please call your pharmacy.  Thank you for choosing [facility]!    Signed, Erma Heritage, PA-C  06/01/2017 8:16 PM    [facility] [address] [place], [place] [address] Phone: [phone]

## 2017-06-01 NOTE — Patient Instructions (Signed)
Medication Instructions:  Your physician has recommended you make the following change in your medication:  Increase Xarelto to 20 mg Daily    Labwork: Your physician recommends that you return for lab work today.    Testing/Procedures: NONE   Follow-Up: Your physician recommends that you schedule a follow-up appointment in: 2 Weeks    Any Other Special Instructions Will Be Listed Below (If Applicable).     If you need a refill on your cardiac medications before your next appointment, please call your pharmacy.  Thank you for choosing [facility]!

## 2017-06-01 NOTE — Telephone Encounter (Signed)
Pt wife says Monday pt had episode of chest pain lasting for an hour - no other symptoms - last 2 days pt HR at home has been 120s-140s - denies chest pain/dizziness/SOB - just c/o palpitations since last night. appt scheduled with extender in [place] @ 230pm today. Pt appreciative

## 2017-06-06 ENCOUNTER — Other Ambulatory Visit: Payer: Self-pay | Admitting: *Deleted

## 2017-06-06 MED ORDER — METOPROLOL TARTRATE 25 MG PO TABS: 25.0000 mg | ORAL_TABLET | Freq: Two times a day (BID) | ORAL | 3 refills | Status: DC

## 2017-06-21 NOTE — Progress Notes (Signed)
Cardiology Office Note  Date: 06/24/2017   ID: Merrell, [place] Aug 26, 1955, MRN [id number]  PCP: Rory Percy, MD  Primary Cardiologist: Rozann Lesches, MD   Chief Complaint  Patient presents with  . PAF  . Coronary Artery Disease    History of Present Illness: [name] is a 62 y.o. male seen most recently by Ms. Strader PA-C in late March.  He was seen at that time after an episode of recurrent palpitations with known history of paroxysmal atrial fibrillation.  He presents today with his wife for follow-up.  He states that he had an episode of tachycardia last night that was present for approximately 2 hours.  Follow-up lab work is outlined below.  I reviewed his medications.  He continues on Xarelto, Lopressor, Lipitor, omega-3 supplements, and as needed nitroglycerin.  Today we discussed the natural history of atrial fibrillation and treatment options.  He is having paroxysmal atrial fibrillation at this point.  Depending on recurrence and symptom frequency, we may need to consider referral to EP to the possibility of antiarrhythmic therapy such as Tikosyn or even ablation.  For now I have asked him to take an additional Lopressor when he has breakthrough episodes.  Past Medical History:  Diagnosis Date  . COPD (chronic obstructive pulmonary disease) ([place])   . Coronary atherosclerosis of native coronary artery    a. s/p DES to RCA in 2006 b. DES to LCx in 2012 with CTO of RCA noted at that time  . Mixed hyperlipidemia   . Myocardial infarction ([place])    NSTEMI 4/12  . Seasonal allergies     Past Surgical History:  Procedure Laterality Date  . CARDIAC CATHETERIZATION N/A 07/26/2014   Procedure: Left Heart Cath and Coronary Angiography;  Surgeon: Peter M Martinique, MD;  Location: [facility];  Service: Cardiovascular;  Laterality: N/A;  . CYSTOSCOPY  01/31/2013  . Nasal reconstruction surgery      Current Outpatient Medications  Medication Sig  Dispense Refill  . acetaminophen (TYLENOL) 500 MG tablet Take 1,000 mg by mouth every 4 (four) hours as needed for moderate pain.     [name] atorvastatin (LIPITOR) 80 MG tablet take 1 tablet by mouth once daily 30 tablet 11  . ATROVENT HFA 17 MCG/ACT inhaler Inhale 2 puffs into the lungs every 6 (six) hours as needed for wheezing.   0  . guaiFENesin (MUCINEX) 600 MG 12 hr tablet Take 1,200 mg by mouth 2 (two) times daily.     [name] loratadine (CLARITIN) 10 MG tablet Take 10 mg by mouth daily.      . metoprolol tartrate (LOPRESSOR) 25 MG tablet Take 1 tablet (25 mg total) by mouth 2 (two) times daily. 180 tablet 3  . Multiple Vitamin (MULTIVITAMIN) tablet Take 1 tablet by mouth daily.      . nitroGLYCERIN (NITROSTAT) 0.4 MG SL tablet place 1 tablet under the tongue every 5 minutes for UP TO 3 doses if needed for angina as directed by prescriber 25 tablet 3  . Omega-3 Fatty Acids (FISH OIL) 1000 MG CAPS Take 2,000 mg by mouth 2 (two) times daily.     [name] QVAR 80 MCG/ACT inhaler Inhale 2 puffs into the lungs 2 (two) times daily.   1  . rivaroxaban (XARELTO) 20 MG TABS tablet Take 1 tablet (20 mg total) by mouth daily with supper. 30 tablet 11  . SPIRIVA HANDIHALER 18 MCG inhalation capsule Place 18 mcg into inhaler and inhale daily.  1  . VENTOLIN HFA 108 (90 BASE) MCG/ACT inhaler Inhale 2 puffs into the lungs every 4 (four) hours as needed for wheezing or shortness of breath.   1   No current facility-administered medications for this visit.    Allergies:  Patient has no known allergies.   Social History: The patient  reports that he quit smoking about 16 months ago. His smoking use included cigarettes. He started smoking about 47 years ago. He has a 22.50 pack-year smoking history. He has never used smokeless tobacco. He reports that he does not drink alcohol or use drugs.   ROS:  Please see the history of present illness. Otherwise, complete review of systems is positive for none.  All other systems are  reviewed and negative.   Physical Exam: VS:  BP 105/74   Pulse 84   Ht 5\' 10"  (1.778 m)   Wt 216 lb 3.2 oz (98.1 kg)   SpO2 95%   BMI 31.02 kg/m , BMI Body mass index is 31.02 kg/m.  Wt Readings from Last 3 Encounters:  06/24/17 216 lb 3.2 oz (98.1 kg)  06/01/17 218 lb (98.9 kg)  04/25/17 224 lb (101.6 kg)    General: Patient appears comfortable at rest. HEENT: Conjunctiva and lids normal, oropharynx clear. Neck: Supple, no elevated JVP or carotid bruits, no thyromegaly. Lungs: Clear to auscultation, nonlabored breathing at rest. Cardiac: Regular rate and rhythm, no S3 or significant systolic murmur, no pericardial rub. Abdomen: Soft, nontender, bowel sounds present. Extremities: No pitting edema, distal pulses 2+. Skin: Warm and dry. Musculoskeletal: No kyphosis. Neuropsychiatric: Alert and oriented x3, affect grossly appropriate.  ECG: I personally reviewed the tracing from 06/01/2017 which showed sinus rhythm with old inferior infarct pattern.  Recent Labwork: 06/01/2017: BUN 16; Creatinine, Ser 0.79; Hemoglobin 18.1; Platelets 367; Potassium 4.1; Sodium 139; TSH 1.401, WBC 14.5, d-dimer 0.45  Other Studies Reviewed Today:  Echocardiogram 05/20/2014 [facility]): Mild LVH with LVEF 60-65%, abnormal diastolic function unspecified, trace aortic regurgitation, RVSP 25 mmHg.  Cardiac catheterization 07/26/2014:  Dist LAD lesion, 30% stenosed.  Dist Cx lesion, 10% stenosed. A drug-eluting stent was placed.  Mid RCA lesion, 100% stenosed.  Mid RCA to Dist RCA lesion, 100% stenosed.  RPDA lesion, 100% stenosed.  1. Single vessel occlusive CAD with CTO of the mid RCA 2. Patent stent in the LCx 3. Good LV function  Assessment and Plan:  1.  Paroxysmal atrial fibrillation.  Plan is to continue Xarelto and Lopressor.  He will take an additional Lopressor for breakthrough palpitations.  Pending on symptom frequency, we can consider referral to EP for discussion of  antiarrhythmic therapy such as Tikosyn versus ablation.  2.  CAD with history of DES to the circumflex and occluded mid RCA.  He reports no progressive angina symptoms on medical therapy.  Current medicines were reviewed with the patient today.  Disposition: Follow-up in 3 months, sooner if needed.   Signed, Satira Sark, MD, [facility] 06/24/2017 2:49 PM    [facility], [place],  [place] [address] Phone: [phone]; Fax: [phone]

## 2017-06-24 ENCOUNTER — Ambulatory Visit: Payer: 59 | Admitting: Cardiology

## 2017-06-24 ENCOUNTER — Encounter: Payer: Self-pay | Admitting: *Deleted

## 2017-06-24 VITALS — BP 105/74 | HR 84 | Ht 70.0 in | Wt 216.2 lb

## 2017-06-24 DIAGNOSIS — I25119 Atherosclerotic heart disease of native coronary artery with unspecified angina pectoris: Secondary | ICD-10-CM | POA: Diagnosis not present

## 2017-06-24 DIAGNOSIS — I480 Paroxysmal atrial fibrillation: Secondary | ICD-10-CM | POA: Diagnosis not present

## 2017-06-24 MED ORDER — METOPROLOL TARTRATE 25 MG PO TABS: 25.0000 mg | ORAL_TABLET | Freq: Two times a day (BID) | ORAL | 3 refills | Status: DC

## 2017-06-24 NOTE — Patient Instructions (Signed)
Medication Instructions:   Your physician recommends that you continue on your current medications as directed. Please refer to the Current Medication list given to you today.  Take an extra lopressor (metoprolol) for symptoms of fast heart rate in the 120's or if you feel that your heart is out of rhythm.  Please keep a record of these episodes if they occur.  Labwork:  NONE  Testing/Procedures:  NONE  Follow-Up:  Your physician recommends that you schedule a follow-up appointment in: 3 months.  Any Other Special Instructions Will Be Listed Below (If Applicable).  If you need a refill on your cardiac medications before your next appointment, please call your pharmacy.

## 2017-07-05 ENCOUNTER — Emergency Department (HOSPITAL_COMMUNITY): Payer: 59

## 2017-07-05 ENCOUNTER — Observation Stay (HOSPITAL_COMMUNITY)
Admission: EM | Admit: 2017-07-05 | Discharge: 2017-07-06 | Disposition: A | Discharge status: Home / Self Care | Payer: 59 | Attending: Internal Medicine | Admitting: Internal Medicine

## 2017-07-05 ENCOUNTER — Encounter (HOSPITAL_COMMUNITY): Payer: Self-pay | Admitting: Emergency Medicine

## 2017-07-05 ENCOUNTER — Telehealth: Payer: Self-pay | Admitting: *Deleted

## 2017-07-05 ENCOUNTER — Other Ambulatory Visit: Payer: Self-pay

## 2017-07-05 ENCOUNTER — Ambulatory Visit: Payer: 59 | Admitting: Cardiology

## 2017-07-05 DIAGNOSIS — F17210 Nicotine dependence, cigarettes, uncomplicated: Secondary | ICD-10-CM | POA: Diagnosis not present

## 2017-07-05 DIAGNOSIS — I2510 Atherosclerotic heart disease of native coronary artery without angina pectoris: Secondary | ICD-10-CM

## 2017-07-05 DIAGNOSIS — I252 Old myocardial infarction: Secondary | ICD-10-CM | POA: Diagnosis not present

## 2017-07-05 DIAGNOSIS — R0789 Other chest pain: Secondary | ICD-10-CM

## 2017-07-05 DIAGNOSIS — Z955 Presence of coronary angioplasty implant and graft: Secondary | ICD-10-CM | POA: Diagnosis not present

## 2017-07-05 DIAGNOSIS — Z79899 Other long term (current) drug therapy: Secondary | ICD-10-CM | POA: Diagnosis not present

## 2017-07-05 DIAGNOSIS — I10 Essential (primary) hypertension: Secondary | ICD-10-CM | POA: Diagnosis present

## 2017-07-05 DIAGNOSIS — Z7901 Long term (current) use of anticoagulants: Secondary | ICD-10-CM | POA: Insufficient documentation

## 2017-07-05 DIAGNOSIS — F17200 Nicotine dependence, unspecified, uncomplicated: Secondary | ICD-10-CM | POA: Diagnosis not present

## 2017-07-05 DIAGNOSIS — E782 Mixed hyperlipidemia: Secondary | ICD-10-CM | POA: Diagnosis not present

## 2017-07-05 DIAGNOSIS — R079 Chest pain, unspecified: Secondary | ICD-10-CM | POA: Diagnosis not present

## 2017-07-05 DIAGNOSIS — E669 Obesity, unspecified: Secondary | ICD-10-CM | POA: Diagnosis present

## 2017-07-05 DIAGNOSIS — E6609 Other obesity due to excess calories: Secondary | ICD-10-CM | POA: Diagnosis not present

## 2017-07-05 DIAGNOSIS — I480 Paroxysmal atrial fibrillation: Secondary | ICD-10-CM | POA: Diagnosis not present

## 2017-07-05 DIAGNOSIS — Z6831 Body mass index (BMI) 31.0-31.9, adult: Secondary | ICD-10-CM

## 2017-07-05 DIAGNOSIS — J449 Chronic obstructive pulmonary disease, unspecified: Secondary | ICD-10-CM | POA: Diagnosis not present

## 2017-07-05 DIAGNOSIS — R0600 Dyspnea, unspecified: Secondary | ICD-10-CM | POA: Diagnosis not present

## 2017-07-05 DIAGNOSIS — I25119 Atherosclerotic heart disease of native coronary artery with unspecified angina pectoris: Secondary | ICD-10-CM

## 2017-07-05 HISTORY — DX: Essential (primary) hypertension: I10

## 2017-07-05 HISTORY — DX: Unspecified asthma, uncomplicated: J45.909

## 2017-07-05 HISTORY — DX: Malignant (primary) neoplasm, unspecified: C80.1

## 2017-07-05 LAB — BASIC METABOLIC PANEL
Anion gap: 11 (ref 5–15)
BUN: 15 mg/dL (ref 6–20)
CHLORIDE: 105 mmol/L (ref 101–111)
CO2: 26 mmol/L (ref 22–32)
CREATININE: 0.87 mg/dL (ref 0.61–1.24)
Calcium: 8.9 mg/dL (ref 8.9–10.3)
GFR calc non Af Amer: >60 mL/min (ref >60)
GLUCOSE: 94 mg/dL (ref 65–99)
Potassium: 3.8 mmol/L (ref 3.5–5.1)
Sodium: 142 mmol/L (ref 135–145)

## 2017-07-05 LAB — CBC
HCT: 49.2 % (ref 39.0–52.0)
HEMOGLOBIN: 16.8 g/dL (ref 13.0–17.0)
MCH: 31.3 pg (ref 26.0–34.0)
MCHC: 34.1 g/dL (ref 30.0–36.0)
MCV: 91.8 fL (ref 78.0–100.0)
PLATELETS: 296 10*3/uL (ref 150–400)
RBC: 5.36 MIL/uL (ref 4.22–5.81)
RDW: 14.4 % (ref 11.5–15.5)
WBC: 12.4 10*3/uL — ABNORMAL HIGH (ref 4.0–10.5)

## 2017-07-05 LAB — TROPONIN I: Troponin I: <0.03 ng/mL (ref <0.03)

## 2017-07-05 LAB — PROTIME-INR
INR: 1.37
Prothrombin Time: 16.8 seconds — ABNORMAL HIGH (ref 11.4–15.2)

## 2017-07-05 MED ORDER — ONDANSETRON HCL 4 MG/2ML IJ SOLN: 4.0000 mg | Freq: Four times a day (QID) | INTRAMUSCULAR | Status: DC | PRN

## 2017-07-05 MED ORDER — ADULT MULTIVITAMIN W/MINERALS CH
1.0000 | ORAL_TABLET | Freq: Every day | ORAL | Status: DC
  Administered 2017-07-06: 1 via ORAL
  Filled 2017-07-05: qty 1

## 2017-07-05 MED ORDER — TIOTROPIUM BROMIDE MONOHYDRATE 18 MCG IN CAPS
18.0000 ug | ORAL_CAPSULE | Freq: Every day | RESPIRATORY_TRACT | Status: DC
  Administered 2017-07-06: 18 ug via RESPIRATORY_TRACT
  Filled 2017-07-05: qty 5

## 2017-07-05 MED ORDER — ACETAMINOPHEN 325 MG PO TABS
650.0000 mg | ORAL_TABLET | ORAL | Status: DC | PRN
  Administered 2017-07-05: 650 mg via ORAL
  Filled 2017-07-05: qty 2

## 2017-07-05 MED ORDER — GI COCKTAIL ~~LOC~~: 30.0000 mL | Freq: Four times a day (QID) | ORAL | Status: DC | PRN

## 2017-07-05 MED ORDER — ASPIRIN EC 81 MG PO TBEC
81.0000 mg | DELAYED_RELEASE_TABLET | Freq: Every day | ORAL | Status: DC
  Administered 2017-07-05 – 2017-07-06 (×2): 81 mg via ORAL
  Filled 2017-07-05 (×2): qty 1

## 2017-07-05 MED ORDER — ALBUTEROL SULFATE (2.5 MG/3ML) 0.083% IN NEBU
2.5000 mg | INHALATION_SOLUTION | Freq: Four times a day (QID) | RESPIRATORY_TRACT | Status: DC
  Administered 2017-07-06 (×2): 2.5 mg via RESPIRATORY_TRACT
  Filled 2017-07-05 (×2): qty 3

## 2017-07-05 MED ORDER — NITROGLYCERIN 0.4 MG SL SUBL: 0.4000 mg | SUBLINGUAL_TABLET | SUBLINGUAL | Status: DC | PRN

## 2017-07-05 MED ORDER — GUAIFENESIN ER 600 MG PO TB12
600.0000 mg | ORAL_TABLET | Freq: Two times a day (BID) | ORAL | Status: DC
  Administered 2017-07-05 – 2017-07-06 (×2): 600 mg via ORAL
  Filled 2017-07-05 (×2): qty 1

## 2017-07-05 MED ORDER — ALBUTEROL SULFATE (2.5 MG/3ML) 0.083% IN NEBU
3.0000 mL | INHALATION_SOLUTION | RESPIRATORY_TRACT | Status: DC | PRN
  Administered 2017-07-05: 3 mL via RESPIRATORY_TRACT
  Filled 2017-07-05: qty 3

## 2017-07-05 MED ORDER — MORPHINE SULFATE (PF) 2 MG/ML IV SOLN: 2.0000 mg | INTRAVENOUS | Status: DC | PRN

## 2017-07-05 MED ORDER — RIVAROXABAN 20 MG PO TABS: 20.0000 mg | ORAL_TABLET | Freq: Every day | ORAL | Status: DC

## 2017-07-05 MED ORDER — IPRATROPIUM BROMIDE 0.02 % IN SOLN: 2.5000 mL | Freq: Four times a day (QID) | RESPIRATORY_TRACT | Status: DC | PRN

## 2017-07-05 MED ORDER — OMEGA-3-ACID ETHYL ESTERS 1 G PO CAPS
1.0000 g | ORAL_CAPSULE | Freq: Two times a day (BID) | ORAL | Status: DC
  Administered 2017-07-05 – 2017-07-06 (×2): 1 g via ORAL
  Filled 2017-07-05 (×2): qty 1

## 2017-07-05 MED ORDER — LORATADINE 10 MG PO TABS
10.0000 mg | ORAL_TABLET | Freq: Every day | ORAL | Status: DC
  Administered 2017-07-06: 10 mg via ORAL
  Filled 2017-07-05: qty 1

## 2017-07-05 MED ORDER — ATORVASTATIN CALCIUM 40 MG PO TABS
80.0000 mg | ORAL_TABLET | Freq: Every day | ORAL | Status: DC
  Administered 2017-07-05: 80 mg via ORAL
  Filled 2017-07-05: qty 2

## 2017-07-05 MED ORDER — METOPROLOL TARTRATE 25 MG PO TABS
25.0000 mg | ORAL_TABLET | Freq: Two times a day (BID) | ORAL | Status: DC
  Administered 2017-07-05 – 2017-07-06 (×2): 25 mg via ORAL
  Filled 2017-07-05 (×2): qty 1

## 2017-07-05 MED ORDER — BUDESONIDE 0.25 MG/2ML IN SUSP
0.2500 mg | Freq: Two times a day (BID) | RESPIRATORY_TRACT | Status: DC
  Administered 2017-07-05 – 2017-07-06 (×2): 0.25 mg via RESPIRATORY_TRACT
  Filled 2017-07-05 (×3): qty 2

## 2017-07-05 NOTE — ED Triage Notes (Addendum)
Pt c/o LT sided non-radiating CP, palpitations, SOB, nausea, and dizziness since Sunday. Hx of Afib. Pt took 1 Nitro PTA with no relief.

## 2017-07-05 NOTE — H&P (Signed)
History and Physical    [name] DOB: February 06, 1956 DOA: 07/05/2017  PCP: Rory Percy, MD   I have briefly reviewed patients previous medical reports in [facility].  Patient coming from: home  Chief Complaint: Chest pain  HPI: [name] is a 62 year old male with past medical history significant for atrial fibrillation (on Xarelto), COPD, tobacco abuse (still actively smoking), coronary artery artery disease (with prior drug-eluting stent to RCA in 2006, DES to LCX in 2012), hyperlipidemia, hypertension and seasonal allergies; who presented to the emergency department secondary to chest pain.  Patient reports for the last 2 days he has been experiencing intermittent episodes of chest discomfort in the middle of his chest, with mild radiation to the left and his arms, 6-7/10 in intensity, lasting seconds up to 5 minutes when present.  Patient expressed associated shortness of breath and mild diaphoresis in couple occassions when the chest pain was present. Patient also experienced DOE over the past several days prior to pain to start. He reported some improvement to symptoms with use of NTG at home.  Patient denies nausea, fever, chills, HA's, coughing, abd pain, dysuria, hematuria, melena, hematochezia or any other complaints.  Of note, he also reported intermittent palpitations and one episode of skipping beats and asymptomatic bradycardia (that he captured with his home heart rate monitor at home).  ED Course: In the ED blood work demonstrated potassium 3.8, creatinine 0.87, BUN 15, mild elevation of WBCs, hemoglobin 16.8 and normal platelets.  Troponin negative x1, no acute ischemic changes on EKG, chest x-ray without acute cardiopulmonary process.  Cardiology was consulted and TRH was called to place patient in observation for further evaluation and ACS rule out.  Review of Systems:  All other systems reviewed and apart from HPI, are negative.  Past  Medical History:  Diagnosis Date  . Atrial fibrillation ([place])   . COPD (chronic obstructive pulmonary disease) ([place])   . Coronary atherosclerosis of native coronary artery    a. s/p DES to RCA in 2006 b. DES to LCx in 2012 with CTO of RCA noted at that time  . Hypertension   . Mixed hyperlipidemia   . Myocardial infarction ([place])    NSTEMI 4/12  . Seasonal allergies     Past Surgical History:  Procedure Laterality Date  . CARDIAC CATHETERIZATION N/A 07/26/2014   Procedure: Left Heart Cath and Coronary Angiography;  Surgeon: Peter M Martinique, MD;  Location: [facility];  Service: Cardiovascular;  Laterality: N/A;  . CYSTOSCOPY  01/31/2013  . Nasal reconstruction surgery      Social History  reports that he has been smoking cigarettes.  He started smoking about 47 years ago. He has a 30.00 pack-year smoking history. He has never used smokeless tobacco. He reports that he does not drink alcohol or use drugs.  No Known Allergies  Family History  Problem Relation Age of Onset  . Coronary artery disease Unknown   . Cancer Mother   . Diabetes Mother   . Heart disease Mother   . COPD Father   . High blood pressure Father   . Heart disease Father     Prior to Admission medications   Medication Sig Start Date End Date Taking? Authorizing Provider  acetaminophen (TYLENOL) 500 MG tablet Take 1,000 mg by mouth every 4 (four) hours as needed for moderate pain.    Yes [provider]  atorvastatin (LIPITOR) 80 MG tablet take 1 tablet by mouth once daily  11/08/16  Yes Satira Sark, MD  ATROVENT HFA 17 MCG/ACT inhaler Inhale 2 puffs into the lungs every 6 (six) hours as needed for wheezing.  06/07/14  Yes [provider]  guaiFENesin (MUCINEX) 600 MG 12 hr tablet Take 600 mg by mouth 2 (two) times daily.    Yes [provider]  loratadine (CLARITIN) 10 MG tablet Take 10 mg by mouth daily.     Yes [provider]  metoprolol tartrate (LOPRESSOR) 25  MG tablet Take 1 tablet (25 mg total) by mouth 2 (two) times daily. Take an extra pill for fast heart rate in the 120's range or for symptoms of palpitations, heart out of rhythm 06/24/17  Yes Satira Sark, MD  Multiple Vitamin (MULTIVITAMIN) tablet Take 1 tablet by mouth daily.     Yes [provider]  nitroGLYCERIN (NITROSTAT) 0.4 MG SL tablet place 1 tablet under the tongue every 5 minutes for UP TO 3 doses if needed for angina as directed by prescriber 04/01/17  Yes Satira Sark, MD  Omega-3 Fatty Acids (FISH OIL) 1000 MG CAPS Take 2,000 mg by mouth 2 (two) times daily.    Yes [provider]  QVAR 80 MCG/ACT inhaler Inhale 2 puffs into the lungs 2 (two) times daily.  07/01/14  Yes [provider]  rivaroxaban (XARELTO) 20 MG TABS tablet Take 1 tablet (20 mg total) by mouth daily with supper. 06/01/17  Yes Strader, Fransisco Hertz, PA-C  SPIRIVA HANDIHALER 18 MCG inhalation capsule Place 18 mcg into inhaler and inhale daily.  07/09/14  Yes [provider]  VENTOLIN HFA 108 (90 BASE) MCG/ACT inhaler Inhale 2 puffs into the lungs every 4 (four) hours as needed for wheezing or shortness of breath.  07/09/14  Yes [provider]    Physical Exam: Vitals:   07/05/17 1209 07/05/17 1213 07/05/17 1757  BP:  106/74 (!) 143/98  Pulse:  64 65  Resp:  (!) 23 20  Temp:  97.7 F (36.5 C) 98.7 F (37.1 C)  TempSrc:  Oral Oral  SpO2:  96% 97%  Weight: 96.2 kg (212 lb)  98.5 kg (217 lb 3.2 oz)  Height: 5\' 10"  (1.778 m)  5\' 10"  (1.778 m)    Constitutional: Afebrile, able to speak in full sentences, currently denying any chest discomfort or palpitations, no nausea, no vomiting, no acute distress.  Good oxygen saturation on room air. Eyes: PERTLA, lids and conjunctivae normal, no icterus, no nystagmus. ENMT: Mucous membranes are moist. Posterior pharynx clear of any exudate or lesions. No rash Neck: supple, no masses, no thyromegaly, no JVD Respiratory:  clear to auscultation bilaterally, no wheezing, no crackles.  Positive scattered rhonchi. Normal respiratory effort. No accessory muscle use.  Cardiovascular: S1 & S2 heard, regular rate and rhythm, no murmurs / rubs / gallops. No extremity edema.  Abdomen: No distension, no tenderness, no masses palpated. No hepatosplenomegaly. Bowel sounds normal.  Musculoskeletal: no clubbing / cyanosis. No joint deformity upper and lower extremities. Good ROM, no contractures. Normal muscle tone.  Skin: no rashes, lesions, ulcers. No induration Neurologic: CN 2-12 grossly intact. Sensation intact, DTR normal. Strength 5/5 in all 4 limbs.  Psychiatric: Normal judgment and insight. Alert and oriented x 3. Normal mood.    Labs on Admission: I have personally reviewed following labs and imaging studies  CBC: Recent Labs  Lab 07/05/17 1301  WBC 12.4*  HGB 16.8  HCT 49.2  MCV 91.8  PLT 296   Basic  Metabolic Panel: Recent Labs  Lab 07/05/17 1301  NA 142  K 3.8  CL 105  CO2 26  GLUCOSE 94  BUN 15  CREATININE 0.87  CALCIUM 8.9   Coagulation Profile: Recent Labs  Lab 07/05/17 1301  INR 1.37   Cardiac Enzymes: Recent Labs  Lab 07/05/17 1301  TROPONINI <0.03    Radiological Exams on Admission: Dg Chest 2 View  Result Date: 07/05/2017 CLINICAL DATA:  Left-sided chest pain and shortness of Breath EXAM: CHEST - 2 VIEW COMPARISON:  01/26/2017 FINDINGS: The heart size and mediastinal contours are within normal limits. Both lungs are mildly hyperinflated. The visualized skeletal structures are unremarkable. IMPRESSION: COPD without acute abnormality. Electronically Signed   By: Inez Catalina M.D.   On: 07/05/2017 13:33    EKG: Independently reviewed.  Currently sinus rhythm, normal axis, no acute ischemic changes appreciated.  Assessment/Plan 1-Chest pain: With significant risk factors including prior coronary artery disease, hypertension, hyperlipidemia, tobacco abuse, gender/age.  Heart  score 5 -Place in observation, telemetry bed -Cycle troponin and EKG -Check 2D echo -Will give aspirin, continue statins, continue beta-blockers, as needed nitroglycerin, morphine; as needed oxygen supplementation. -Cardiology has been consulted and has recommended keeping the patient n.p.o. after midnight in case any studies is desired. -No heparin drip at this moment in the absence of EKG changes or elevation in troponin  2-Coronary atherosclerosis of native coronary artery -follow troponin -continue b-blocker and statins  -monitor on telemetry -follow 2-D echo  3-Mixed hyperlipidemia -will check lipid panel -continue statins   4-Tobacco use disorder -Cessation counseling has been provided (over 7 minutes spent discussing increased risk factors for coronary artery disease, lung cancer, worsening COPD, stomach and kidney malignancy associated with tobacco abuse.  Patient was also provided with tips that can help him stop smoking and also the proper use of nicotine supplementation products). -He has declined nicotine patch.  5-Essential hypertension, benign  -stable and well-controlled  -will continue Metoprolol -Heart healthy diet has been noted.  6- class 1 Obesity -Body mass index is 31.16 kg/m.  -Low calorie diet and exercise has been discussed with patient.  7-PAF (paroxysmal atrial fibrillation) (HCC) -Stable and currently with sinus rhythm. -CHADsVASC score 3-4 -continue metoprolol -continue xarelto for now -monitoring on telemetry -will benefit of EP evaluation (given episodes of bradycardia and skipping beats sensation).  8-COPD (chronic obstructive pulmonary disease) ([place]) -Continue home inhalers treatment -Breathing is a stable the patient is currently no wheezing. -Strongly encourage to quit smoking.   Time: 65 minutes   DVT prophylaxis: Chronically on Xarelto Code Status: Full code Family Communication: No family at bedside Disposition Plan: Discharge  home once medically stable and ACS rule out. Consults called: Cardiology service Admission status: Observation, telemetry bed, LOS < 2 midnights.   Barton Dubois MD [facility] Pager [phone]  If 7PM-7AM, please contact night-coverage [email or web address] Password [facility]  07/05/2017, 6:32 PM

## 2017-07-05 NOTE — Consult Note (Signed)
Cardiology Consultation:   Patient ID: [name]; [id number]; Apr 10, 1955   Admit date: 07/05/2017 Date of Consult: 07/05/2017  Primary Care Provider: Rory Percy, MD Primary Cardiologist: Rozann Lesches, MD  Primary Electrophysiologist:  na   Patient Profile:   [name] is a 62 y.o. male with a hx of PAD, CAD, COPD who is being seen today for the evaluation of chest pain at the request of Dr Lacinda Axon.  History of Present Illness:   [name] history of afib, COPD, CAD with prior DES to RCA in 2006, DES to LCX in 2012 with CTO to RCA managed medically, HTN, HL, presents with chest pain. Last cath 07/2014 patent vessels other than RCA CTO.  Reports episodes started Sunday while eating lunch. Dull/pressure left chest into left axilla with some SOB. Lasting a few seconds at a time, recurrent with multiple episodes throughout the day. Not positional, not related to food. Reports increased DOE over the last several days, Interminttent mild LE edema.  Reports intermittent palpitations separate from the chest pain episodes, just 1-2 beats at time. Reports episode of low heart rates this AM to high 30s on his home monitor.    K 3.8 Cr 0.87 BUN 15 WBC 12.4 Hgb 16.8 Plt 296 INR 1.37 Trop neg x 1 CXR without acute process EKG SR, no acute ischemic changes 05/2014 echo: LVEF 60-65%, abnormal diastolic function   Past Medical History:  Diagnosis Date  . Atrial fibrillation ([place])   . COPD (chronic obstructive pulmonary disease) ([place])   . Coronary atherosclerosis of native coronary artery    a. s/p DES to RCA in 2006 b. DES to LCx in 2012 with CTO of RCA noted at that time  . Hypertension   . Mixed hyperlipidemia   . Myocardial infarction ([place])    NSTEMI 4/12  . Seasonal allergies     Past Surgical History:  Procedure Laterality Date  . CARDIAC CATHETERIZATION N/A 07/26/2014   Procedure: Left Heart Cath and Coronary Angiography;  Surgeon: Peter M Martinique, MD;  Location: [place] [facility];  Service: Cardiovascular;  Laterality: N/A;  . CYSTOSCOPY  01/31/2013  . Nasal reconstruction surgery       Inpatient Medications: Scheduled Meds:  Continuous Infusions:  PRN Meds:   Allergies:   No Known Allergies  Social History:   Social History   Socioeconomic History  . Marital status: Married    Spouse name: Not on file  . Number of children: Not on file  . Years of education: Not on file  . Highest education level: Not on file  Occupational History  . Occupation: Tree surgeon  . Financial resource strain: Not on file  . Food insecurity:    Worry: Not on file    Inability: Not on file  . Transportation needs:    Medical: Not on file    Non-medical: Not on file  Tobacco Use  . Smoking status: Current Every Day Smoker    Packs/day: 1.00    Years: 30.00    Pack years: 30.00    Types: Cigarettes    Start date: 03/12/1970  . Smokeless tobacco: Never Used  Substance and Sexual Activity  . Alcohol use: No    Alcohol/week: 0.0 oz  . Drug use: No  . Sexual activity: Yes  Lifestyle  . Physical activity:    Days per week: Not on file    Minutes per session: Not on file  . Stress: Not on file  Relationships  .  Social connections:    Talks on phone: Not on file    Gets together: Not on file    Attends religious service: Not on file    Active member of club or organization: Not on file    Attends meetings of clubs or organizations: Not on file    Relationship status: Not on file  . Intimate partner violence:    Fear of current or ex partner: Not on file    Emotionally abused: Not on file    Physically abused: Not on file    Forced sexual activity: Not on file  Other Topics Concern  . Not on file  Social History Narrative  . Not on file    Family History:    Family History  Problem Relation Age of Onset  . Coronary artery disease Unknown   . Cancer Mother   . Diabetes Mother   . Heart disease Mother   . COPD Father   .  High blood pressure Father   . Heart disease Father      ROS:  Please see the history of present illness. All other ROS reviewed and negative.     Physical Exam/Data:   Vitals:   07/05/17 1209 07/05/17 1213  BP:  106/74  Pulse:  64  Resp:  (!) 23  Temp:  97.7 F (36.5 C)  TempSrc:  Oral  SpO2:  96%  Weight: 212 lb (96.2 kg)   Height: 5\' 10"  (1.778 m)    No intake or output data in the 24 hours ending 07/05/17 1450 Filed Weights   07/05/17 1209  Weight: 212 lb (96.2 kg)   Body mass index is 30.42 kg/m.  General:  Well nourished, well developed, in no acute distress HEENT: normal Lymph: no adenopathy Neck: no JVD Endocrine:  No thryomegaly Cardiac:  normal S1, S2; RRR; no murmur  Lungs:  clear to auscultation bilaterally, no wheezing, rhonchi or rales  Abd: soft, nontender, no hepatomegaly  Ext: no edema Musculoskeletal:  No deformities, BUE and BLE strength normal and equal Skin: warm and dry  Neuro:  CNs 2-12 intact, no focal abnormalities noted Psych:  Normal affect     Laboratory Data:  Chemistry Recent Labs  Lab 07/05/17 1301  NA 142  K 3.8  CL 105  CO2 26  GLUCOSE 94  BUN 15  CREATININE 0.87  CALCIUM 8.9  GFRNONAA >60  GFRAA >60  ANIONGAP 11    No results for input(s): PROT, ALBUMIN, AST, ALT, ALKPHOS, BILITOT in the last 168 hours. Hematology Recent Labs  Lab 07/05/17 1301  WBC 12.4*  RBC 5.36  HGB 16.8  HCT 49.2  MCV 91.8  MCH 31.3  MCHC 34.1  RDW 14.4  PLT 296   Cardiac Enzymes Recent Labs  Lab 07/05/17 1301  TROPONINI <0.03   No results for input(s): TROPIPOC in the last 168 hours.  BNPNo results for input(s): BNP, PROBNP in the last 168 hours.  DDimer No results for input(s): DDIMER in the last 168 hours.  Radiology/Studies:  Dg Chest 2 View  Result Date: 07/05/2017 CLINICAL DATA:  Left-sided chest pain and shortness of Breath EXAM: CHEST - 2 VIEW COMPARISON:  01/26/2017 FINDINGS: The heart size and mediastinal  contours are within normal limits. Both lungs are mildly hyperinflated. The visualized skeletal structures are unremarkable. IMPRESSION: COPD without acute abnormality. Electronically Signed   By: Inez Catalina M.D.   On: 07/05/2017 13:33    Assessment and Plan:   1. CAD/Chest pain -  mixed symptoms, not overally consistent with ACS. Initial EKG and enzymes not conistent with ischemia.  - recommend observation overnight at [facility] cycle EKG and enzymes, obtain echo. Make npo overnight in case ischemic testing is indicated.  - do not see indication for heparin gtt at this time, if troponin elevation or EKG changes would start at that time.   2. PAF - mild short duration palpitations. Tele in ER shows occasional PVCs isolated and couplets, could be cause. He reports episode of bradycardia to 30s today. - follow telemetry overnight. - continue anticoagulation. Hold xarelto tomorrow    Recommend observation overnight at [facility] to medicine team.    For questions or updates, please contact [place] Please consult [email or web address] for contact info under Cardiology/STEMI.   Merrily Pew, MD  07/05/2017 2:50 PM

## 2017-07-05 NOTE — Telephone Encounter (Signed)
I just saw him on April 12 at which point he was not reporting chest pain.  If this is new onset and persistent, he should be seen in the ER to make sure that this is not related to an ischemic issue rather than his atrial fibrillation.  Can go to [facility], but [facility] may [facility] best choice in case he needs a cardiac catheterization in the short term.

## 2017-07-05 NOTE — ED Provider Notes (Signed)
[facility] EMERGENCY DEPARTMENT Provider Note   CSN: [id number] Arrival date & time: 07/05/17  1203     History   Chief Complaint Chief Complaint  Patient presents with  . Chest Pain    HPI [name] is a 62 y.o. male.  Patient presents with intermittent central chest pain and dyspnea for 2-3 days nonradiating.  Patient has a known history of CAD with myocardial infarction x2 and multiple stents.  He also has a history of atrial fibrillation and takes Xarelto.  He states his pulse was 34 earlier today and 124 on Sunday, both readings resolved to normal.  He took 1 nitroglycerin tablet today which helped a minimal amount.  Severity of symptoms is moderate.  Nothing makes symptoms better or worse.     Past Medical History:  Diagnosis Date  . Atrial fibrillation ([place])   . COPD (chronic obstructive pulmonary disease) ([place])   . Coronary atherosclerosis of native coronary artery    a. s/p DES to RCA in 2006 b. DES to LCx in 2012 with CTO of RCA noted at that time  . Hypertension   . Mixed hyperlipidemia   . Myocardial infarction ([place])    NSTEMI 4/12  . Seasonal allergies     Patient Active Problem List   Diagnosis Date Noted  . Chest pain 07/05/2017  . Angina pectoris syndrome ([place]) 07/26/2014  . Coronary atherosclerosis of native coronary artery 07/07/2010  . Mixed hyperlipidemia 07/07/2010  . Tobacco use disorder 07/07/2010  . Essential hypertension, benign 07/07/2010  . Obesity 07/07/2010    Past Surgical History:  Procedure Laterality Date  . CARDIAC CATHETERIZATION N/A 07/26/2014   Procedure: Left Heart Cath and Coronary Angiography;  Surgeon: Peter M Martinique, MD;  Location: [facility];  Service: Cardiovascular;  Laterality: N/A;  . CYSTOSCOPY  01/31/2013  . Nasal reconstruction surgery          Home Medications    Prior to Admission medications   Medication Sig Start Date End Date Taking? Authorizing Provider  acetaminophen (TYLENOL) 500 MG  tablet Take 1,000 mg by mouth every 4 (four) hours as needed for moderate pain.    Yes [provider]  atorvastatin (LIPITOR) 80 MG tablet take 1 tablet by mouth once daily 11/08/16  Yes Satira Sark, MD  ATROVENT HFA 17 MCG/ACT inhaler Inhale 2 puffs into the lungs every 6 (six) hours as needed for wheezing.  06/07/14  Yes [provider]  guaiFENesin (MUCINEX) 600 MG 12 hr tablet Take 600 mg by mouth 2 (two) times daily.    Yes [provider]  loratadine (CLARITIN) 10 MG tablet Take 10 mg by mouth daily.     Yes [provider]  metoprolol tartrate (LOPRESSOR) 25 MG tablet Take 1 tablet (25 mg total) by mouth 2 (two) times daily. Take an extra pill for fast heart rate in the 120's range or for symptoms of palpitations, heart out of rhythm 06/24/17  Yes Satira Sark, MD  Multiple Vitamin (MULTIVITAMIN) tablet Take 1 tablet by mouth daily.     Yes [provider]  nitroGLYCERIN (NITROSTAT) 0.4 MG SL tablet place 1 tablet under the tongue every 5 minutes for UP TO 3 doses if needed for angina as directed by prescriber 04/01/17  Yes Satira Sark, MD  Omega-3 Fatty Acids (FISH OIL) 1000 MG CAPS Take 2,000 mg by mouth 2 (two) times daily.    Yes [provider]  QVAR 80 MCG/ACT inhaler  Inhale 2 puffs into the lungs 2 (two) times daily.  07/01/14  Yes [provider]  rivaroxaban (XARELTO) 20 MG TABS tablet Take 1 tablet (20 mg total) by mouth daily with supper. 06/01/17  Yes Strader, Fransisco Hertz, PA-C  SPIRIVA HANDIHALER 18 MCG inhalation capsule Place 18 mcg into inhaler and inhale daily.  07/09/14  Yes [provider]  VENTOLIN HFA 108 (90 BASE) MCG/ACT inhaler Inhale 2 puffs into the lungs every 4 (four) hours as needed for wheezing or shortness of breath.  07/09/14  Yes [provider]    Family History Family History  Problem Relation Age of Onset  . Coronary artery disease Unknown   . Cancer Mother   .  Diabetes Mother   . Heart disease Mother   . COPD Father   . High blood pressure Father   . Heart disease Father     Social History Social History   Tobacco Use  . Smoking status: Current Every Day Smoker    Packs/day: 1.00    Years: 30.00    Pack years: 30.00    Types: Cigarettes    Start date: 03/12/1970  . Smokeless tobacco: Never Used  Substance Use Topics  . Alcohol use: No    Alcohol/week: 0.0 oz  . Drug use: No     Allergies   Patient has no known allergies.   Review of Systems Review of Systems  All other systems reviewed and are negative.    Physical Exam Updated Vital Signs BP 106/74 (BP Location: Right Arm)   Pulse 64   Temp 97.7 F (36.5 C) (Oral)   Resp (!) 23   Ht 5\' 10"  (1.778 m)   Wt 96.2 kg (212 lb)   SpO2 96%   BMI 30.42 kg/m   Physical Exam  Constitutional: He is oriented to person, place, and time. He appears well-developed and well-nourished.  HENT:  Head: Normocephalic and atraumatic.  Eyes: Conjunctivae are normal.  Neck: Neck supple.  Cardiovascular: Normal rate and regular rhythm.  Pulmonary/Chest: Effort normal and breath sounds normal.  Abdominal: Soft. Bowel sounds are normal.  Musculoskeletal: Normal range of motion.  Neurological: He is alert and oriented to person, place, and time.  Skin: Skin is warm and dry.  Psychiatric: He has a normal mood and affect. His behavior is normal.  Nursing note and vitals reviewed.    ED Treatments / Results  Labs (all labs ordered are listed, but only abnormal results are displayed) Labs Reviewed  CBC - Abnormal; Notable for the following components:      Result Value   WBC 12.4 (*)    All other components within normal limits  PROTIME-INR - Abnormal; Notable for the following components:   Prothrombin Time 16.8 (*)    All other components within normal limits  BASIC METABOLIC PANEL  TROPONIN I    EKG EKG Interpretation  Date/Time:  Tuesday July 05 2017 12:11:52  EDT Ventricular Rate:  62 PR Interval:    QRS Duration: 90 QT Interval:  410 QTC Calculation: 417 R Axis:   8 Text Interpretation:  Sinus rhythm Consider inferior infarct Minimal ST elevation, anterior leads Baseline wander in lead(s) V4 Confirmed by Nat Christen [phone]) on 07/05/2017 12:41:05 PM   Radiology Dg Chest 2 View  Result Date: 07/05/2017 CLINICAL DATA:  Left-sided chest pain and shortness of Breath EXAM: CHEST - 2 VIEW COMPARISON:  01/26/2017 FINDINGS: The heart size and mediastinal contours are within normal limits. Both lungs are  mildly hyperinflated. The visualized skeletal structures are unremarkable. IMPRESSION: COPD without acute abnormality. Electronically Signed   By: Inez Catalina M.D.   On: 07/05/2017 13:33    Procedures Procedures (including critical care time)  Medications Ordered in ED Medications - No data to display   Initial Impression / Assessment and Plan / ED Course  I have reviewed the triage vital signs and the nursing notes.  Pertinent labs & imaging results that were available during my care of the patient were reviewed by me and considered in my medical decision making (see chart for details).     History and physical most consistent with cardiac etiology.  Doubt pulmonary embolism.  EKG, troponin, chest x-ray showed no acute findings.  Will consult cardiology and admit to general medicine.  Final Clinical Impressions(s) / ED Diagnoses   Final diagnoses:  Chest pain, unspecified type    ED Discharge Orders    None       Nat Christen, MD 07/05/17 1635

## 2017-07-05 NOTE — Telephone Encounter (Signed)
Patient notified and voiced understanding.

## 2017-07-05 NOTE — Telephone Encounter (Signed)
Spoke with pt and wife who state that pt has been having chest pain since Sunday. Pt reports pain is 4/10 to left side of chest, he denies worsening SOB. He describes pain as dull and constant. He reports that when this episode started his HR was 127. Pt reports that heart rates have been between 38-60. When asked BP pt reports 104/76 Hr 46 O2 93 %. At this time patient denies anything making CP worse or better. He has been scheduled to be seen in the [place] office at 4:20 today. Please advise.

## 2017-07-06 ENCOUNTER — Encounter (HOSPITAL_COMMUNITY): Payer: Self-pay

## 2017-07-06 ENCOUNTER — Observation Stay (HOSPITAL_BASED_OUTPATIENT_CLINIC_OR_DEPARTMENT_OTHER): Payer: 59

## 2017-07-06 DIAGNOSIS — F17200 Nicotine dependence, unspecified, uncomplicated: Secondary | ICD-10-CM

## 2017-07-06 DIAGNOSIS — R079 Chest pain, unspecified: Secondary | ICD-10-CM

## 2017-07-06 DIAGNOSIS — I25119 Atherosclerotic heart disease of native coronary artery with unspecified angina pectoris: Secondary | ICD-10-CM

## 2017-07-06 DIAGNOSIS — E782 Mixed hyperlipidemia: Secondary | ICD-10-CM | POA: Diagnosis not present

## 2017-07-06 DIAGNOSIS — I10 Essential (primary) hypertension: Secondary | ICD-10-CM | POA: Diagnosis not present

## 2017-07-06 DIAGNOSIS — I480 Paroxysmal atrial fibrillation: Secondary | ICD-10-CM | POA: Diagnosis not present

## 2017-07-06 DIAGNOSIS — J439 Emphysema, unspecified: Secondary | ICD-10-CM

## 2017-07-06 DIAGNOSIS — R0789 Other chest pain: Secondary | ICD-10-CM

## 2017-07-06 LAB — CBC
HCT: 49.4 % (ref 39.0–52.0)
Hemoglobin: 16.4 g/dL (ref 13.0–17.0)
MCH: 31.2 pg (ref 26.0–34.0)
MCHC: 33.2 g/dL (ref 30.0–36.0)
MCV: 94.1 fL (ref 78.0–100.0)
PLATELETS: 305 10*3/uL (ref 150–400)
RBC: 5.25 MIL/uL (ref 4.22–5.81)
RDW: 14.2 % (ref 11.5–15.5)
WBC: 13.9 10*3/uL — AB (ref 4.0–10.5)

## 2017-07-06 LAB — BASIC METABOLIC PANEL
ANION GAP: 10 (ref 5–15)
BUN: 17 mg/dL (ref 6–20)
CO2: 23 mmol/L (ref 22–32)
Calcium: 8.6 mg/dL — ABNORMAL LOW (ref 8.9–10.3)
Chloride: 105 mmol/L (ref 101–111)
Creatinine, Ser: 0.75 mg/dL (ref 0.61–1.24)
Glucose, Bld: 98 mg/dL (ref 65–99)
POTASSIUM: 3.8 mmol/L (ref 3.5–5.1)
SODIUM: 138 mmol/L (ref 135–145)

## 2017-07-06 LAB — NM MYOCAR MULTI W/SPECT W/WALL MOTION / EF
CHL CUP RESTING HR STRESS: 51 {beats}/min
LVDIAVOL: 129 mL (ref 62–150)
LVSYSVOL: 65 mL
NUC STRESS TID: 1.32
Peak HR: 75 {beats}/min
RATE: 0.38
SDS: 2
SRS: 5
SSS: 7

## 2017-07-06 LAB — ECHOCARDIOGRAM COMPLETE
HEIGHTINCHES: 70 in
WEIGHTICAEL: 3475.2 [oz_av]

## 2017-07-06 LAB — TROPONIN I

## 2017-07-06 MED ORDER — REGADENOSON 0.4 MG/5ML IV SOLN
INTRAVENOUS | Status: AC
  Administered 2017-07-06: 0.4 mg via INTRAVENOUS
  Filled 2017-07-06: qty 5

## 2017-07-06 MED ORDER — SODIUM CHLORIDE 0.9% FLUSH
INTRAVENOUS | Status: AC
  Administered 2017-07-06: 10 mL via INTRAVENOUS
  Filled 2017-07-06: qty 10

## 2017-07-06 MED ORDER — TECHNETIUM TC 99M TETROFOSMIN IV KIT
10.0000 | PACK | Freq: Once | INTRAVENOUS | Status: AC | PRN
  Administered 2017-07-06: 11 via INTRAVENOUS

## 2017-07-06 MED ORDER — TECHNETIUM TC 99M TETROFOSMIN IV KIT
30.0000 | PACK | Freq: Once | INTRAVENOUS | Status: AC | PRN
  Administered 2017-07-06: 29.8 via INTRAVENOUS

## 2017-07-06 NOTE — Discharge Summary (Signed)
Physician Discharge Summary  [name] PTW:[id number] DOB: 04/06/1955 DOA: 07/05/2017  PCP: Rory Percy, MD  Admit date: 07/05/2017 Discharge date: 07/06/2017  Admitted From: home Disposition:  Home  Recommendations for Outpatient Follow-up:  1. Follow up with PCP in 1-2 weeks 2. Please obtain BMP/CBC in one week   Discharge Condition: Stable CODE STATUS: FULL Diet recommendation: Heart Healthy   Brief/Interim Summary: 62 year old male with a history of atrial fibrillation, COPD, tobacco abuse, coronary artery disease with DES 2006, hyperlipidemia, hypertension presenting with 2 to 3-day history of chest discomfort that was substernal in nature.  Patient states that his chest discomfort usually lasts up to 5 seconds, and is sharp in nature.  He states that it usually occurs at rest although he has had some dyspnea on exertion over the past 2 weeks.  He continues to smoke 1/2 pack/day, but has 80-pack-year history.  He denies any fever, chills, cough, hemoptysis, nausea, vomiting, diarrhea, abdominal pain, dysuria.  Cardiology was consulted to assist with management.  Troponins were negative x4.  The patient underwent a nuclear medicine stress test which was low risk.  He was subsequently cleared by cardiology for discharge.    Discharge Diagnoses:  Chest pain -Mostly atypical by history, although patient has had some dyspnea on exertion -Cardiology consult appreciated -4/24--Echo--EF 60 to 65%, possible HK basal inferior myocardium, trivial MR/TR -troponin neg x 4 -Nuclear medicine stress test--low risk study, EF 50% -Personally reviewed EKG--sinus rhythm, no ST-T wave changes -Personally reviewed chest x-ray--hyperinflation -07/26/2014 heart catheterization--single-vessel CAD, CTO of mid RCA, patent left circumflex stent  Coronary artery disease -prior drug-eluting stent to RCA in 2006, DES to LCX in 2012 -Continue aspirin and statin  Essential  hypertension -Continue metoprolol tartrate  Paroxysmal atrial fibrillation -Continue rivaroxaban -Continue metoprolol tartrate -Patient has had episodes of tachycardia as well as bradycardia -Cardiology plans eventual referral to EP  Tobacco abuse -Tobacco cessation discussed  COPD -Stable on room air -Continue Pulmicort and Spiriva  Mixed hyperlipidemia -Continue Lovenox and statin      Discharge Instructions   Allergies as of 07/06/2017   No Known Allergies     Medication List    TAKE these medications   acetaminophen 500 MG tablet Commonly known as:  TYLENOL Take 1,000 mg by mouth every 4 (four) hours as needed for moderate pain.   atorvastatin 80 MG tablet Commonly known as:  LIPITOR take 1 tablet by mouth once daily   ATROVENT HFA 17 MCG/ACT inhaler Generic drug:  ipratropium Inhale 2 puffs into the lungs every 6 (six) hours as needed for wheezing.   Fish Oil 1000 MG Caps Take 2,000 mg by mouth 2 (two) times daily.   guaiFENesin 600 MG 12 hr tablet Commonly known as:  MUCINEX Take 600 mg by mouth 2 (two) times daily.   loratadine 10 MG tablet Commonly known as:  CLARITIN Take 10 mg by mouth daily.   metoprolol tartrate 25 MG tablet Commonly known as:  LOPRESSOR Take 1 tablet (25 mg total) by mouth 2 (two) times daily. Take an extra pill for fast heart rate in the 120's range or for symptoms of palpitations, heart out of rhythm   multivitamin tablet Take 1 tablet by mouth daily.   nitroGLYCERIN 0.4 MG SL tablet Commonly known as:  NITROSTAT place 1 tablet under the tongue every 5 minutes for UP TO 3 doses if needed for angina as directed by prescriber   QVAR 80 MCG/ACT inhaler Generic drug:  beclomethasone Inhale 2  puffs into the lungs 2 (two) times daily.   rivaroxaban 20 MG Tabs tablet Commonly known as:  XARELTO Take 1 tablet (20 mg total) by mouth daily with supper.   SPIRIVA HANDIHALER 18 MCG inhalation capsule Generic drug:   tiotropium Place 18 mcg into inhaler and inhale daily.   VENTOLIN HFA 108 (90 Base) MCG/ACT inhaler Generic drug:  albuterol Inhale 2 puffs into the lungs every 4 (four) hours as needed for wheezing or shortness of breath.       No Known Allergies  Consultations:  cardiology   Procedures/Studies: Dg Chest 2 View  Result Date: 07/05/2017 CLINICAL DATA:  Left-sided chest pain and shortness of Breath EXAM: CHEST - 2 VIEW COMPARISON:  01/26/2017 FINDINGS: The heart size and mediastinal contours are within normal limits. Both lungs are mildly hyperinflated. The visualized skeletal structures are unremarkable. IMPRESSION: COPD without acute abnormality. Electronically Signed   By: Inez Catalina M.D.   On: 07/05/2017 13:33   Nm Myocar Multi W/spect W/wall Motion / Ef  Result Date: 07/06/2017  There was no ST segment deviation noted during stress.  Findings consistent with prior large inferior myocardial infarction with mild peri-infarct ischemia.  This is a low risk study. Minimal myocardium currently at jeopardy.  The left ventricular ejection fraction is low normal (50%).         Discharge Exam: Vitals:   07/06/17 1300 07/06/17 1310  BP:    Pulse:    Resp:    Temp:    SpO2: 93% 96%   Vitals:   07/06/17 0800 07/06/17 0925 07/06/17 1300 07/06/17 1310  BP:      Pulse:  (!) 59    Resp:      Temp:      TempSrc:      SpO2: 98%  93% 96%  Weight:      Height:        General: Pt is alert, awake, not in acute distress Cardiovascular: RRR, S1/S2 +, no rubs, no gallops Respiratory: CTA bilaterally, no wheezing, no rhonchi Abdominal: Soft, NT, ND, bowel sounds + Extremities: no edema, no cyanosis   The results of significant diagnostics from this hospitalization (including imaging, microbiology, ancillary and laboratory) are listed below for reference.    Significant Diagnostic Studies: Dg Chest 2 View  Result Date: 07/05/2017 CLINICAL DATA:  Left-sided chest pain and  shortness of Breath EXAM: CHEST - 2 VIEW COMPARISON:  01/26/2017 FINDINGS: The heart size and mediastinal contours are within normal limits. Both lungs are mildly hyperinflated. The visualized skeletal structures are unremarkable. IMPRESSION: COPD without acute abnormality. Electronically Signed   By: Inez Catalina M.D.   On: 07/05/2017 13:33   Nm Myocar Multi W/spect W/wall Motion / Ef  Result Date: 07/06/2017  There was no ST segment deviation noted during stress.  Findings consistent with prior large inferior myocardial infarction with mild peri-infarct ischemia.  This is a low risk study. Minimal myocardium currently at jeopardy.  The left ventricular ejection fraction is low normal (50%).      Microbiology: No results found for this or any previous visit (from the past 240 hour(s)).   Labs: Basic Metabolic Panel: Recent Labs  Lab 07/05/17 1301 07/06/17 0453  NA 142 138  K 3.8 3.8  CL 105 105  CO2 26 23  GLUCOSE 94 98  BUN 15 17  CREATININE 0.87 0.75  CALCIUM 8.9 8.6*   Liver Function Tests: No results for input(s): AST, ALT, ALKPHOS, BILITOT, PROT, ALBUMIN in the  last 168 hours. No results for input(s): LIPASE, AMYLASE in the last 168 hours. No results for input(s): AMMONIA in the last 168 hours. CBC: Recent Labs  Lab 07/05/17 1301 07/06/17 0453  WBC 12.4* 13.9*  HGB 16.8 16.4  HCT 49.2 49.4  MCV 91.8 94.1  PLT 296 305   Cardiac Enzymes: Recent Labs  Lab 07/05/17 1301 07/05/17 1855 07/05/17 2108 07/06/17 0016  TROPONINI <0.03 <0.03 <0.03 <0.03   BNP: Invalid input(s): POCBNP CBG: No results for input(s): GLUCAP in the last 168 hours.  Time coordinating discharge:  36 minutes  Signed:  Orson Eva, DO [facility] Pager: [phone] 07/06/2017, 4:58 PM

## 2017-07-06 NOTE — Progress Notes (Signed)
Progress Note  Patient Name: [name] Date of Encounter: 07/06/2017  Primary Cardiologist: Rozann Lesches, MD   Subjective   No chest pain currently.  Presented with fairly atypical, brief episodes of chest discomfort lasting a few seconds but recurring for a few days.  No definite palpitations.  Compliant with medications.  No dizziness or syncope.  Inpatient Medications    Scheduled Meds: . albuterol  2.5 mg Nebulization QID  . aspirin EC  81 mg Oral Daily  . atorvastatin  80 mg Oral q1800  . budesonide (PULMICORT) nebulizer solution  0.25 mg Nebulization BID  . guaiFENesin  600 mg Oral BID  . loratadine  10 mg Oral Daily  . metoprolol tartrate  25 mg Oral BID  . multivitamin with minerals  1 tablet Oral Daily  . omega-3 acid ethyl esters  1 g Oral BID  . rivaroxaban  20 mg Oral Q supper  . tiotropium  18 mcg Inhalation Daily    PRN Meds: acetaminophen, albuterol, gi cocktail, ipratropium, morphine injection, nitroGLYCERIN, ondansetron (ZOFRAN) IV   Vital Signs    Vitals:   07/05/17 2106 07/06/17 0527 07/06/17 0757 07/06/17 0800  BP:  131/85    Pulse:  (!) 53    Resp:      Temp:  97.9 F (36.6 C)    TempSrc:  Oral    SpO2: 94% 92% 95% 98%  Weight:      Height:        Intake/Output Summary (Last 24 hours) at 07/06/2017 0816 Last data filed at 07/06/2017 0800 Gross per 24 hour  Intake 0 ml  Output -  Net 0 ml   Filed Weights   07/05/17 1209 07/05/17 1757  Weight: 212 lb (96.2 kg) 217 lb 3.2 oz (98.5 kg)    Telemetry    Sinus rhythm with rare to occasional PVCs and PACs. Personally Reviewed  ECG    Sinus bradycardia, HR 54, with PVC's and inferior Q-waves. Personally Reviewed  Physical Exam   General: Patient appears comfortable at rest. Neck: Supple, no elevated JVP or carotid bruits, no thyromegaly. Lungs: Clear to auscultation, nonlabored breathing at rest. Cardiac: Regular rate and rhythm, no S3, soft systolic murmur. Abdomen: Soft,  nontender, bowel sounds present. Extremities: No pitting edema, distal pulses 2+. Skin: Warm and dry. Musculoskeletal: No kyphosis. Neuropsychiatric: Alert and oriented x3, affect grossly appropriate.  Labs    Chemistry Recent Labs  Lab 07/05/17 1301 07/06/17 0453  NA 142 138  K 3.8 3.8  CL 105 105  CO2 26 23  GLUCOSE 94 98  BUN 15 17  CREATININE 0.87 0.75  CALCIUM 8.9 8.6*  GFRNONAA >60 >60  GFRAA >60 >60  ANIONGAP 11 10     Hematology Recent Labs  Lab 07/05/17 1301 07/06/17 0453  WBC 12.4* 13.9*  RBC 5.36 5.25  HGB 16.8 16.4  HCT 49.2 49.4  MCV 91.8 94.1  MCH 31.3 31.2  MCHC 34.1 33.2  RDW 14.4 14.2  PLT 296 305    Cardiac Enzymes Recent Labs  Lab 07/05/17 1301 07/05/17 1855 07/05/17 2108 07/06/17 0016  TROPONINI <0.03 <0.03 <0.03 <0.03   No results for input(s): TROPIPOC in the last 168 hours.    Radiology    Dg Chest 2 View  Result Date: 07/05/2017 CLINICAL DATA:  Left-sided chest pain and shortness of Breath EXAM: CHEST - 2 VIEW COMPARISON:  01/26/2017 FINDINGS: The heart size and mediastinal contours are within normal limits. Both lungs are mildly hyperinflated.  The visualized skeletal structures are unremarkable. IMPRESSION: COPD without acute abnormality. Electronically Signed   By: Inez Catalina M.D.   On: 07/05/2017 13:33    Cardiac Studies   Cardiac Catheterization: 07/2014:  Dist LAD lesion, 30% stenosed.  Dist Cx lesion, 10% stenosed. A drug-eluting stent was placed.  Mid RCA lesion, 100% stenosed.  Mid RCA to Dist RCA lesion, 100% stenosed.  RPDA lesion, 100% stenosed.   1. Single vessel occlusive CAD with CTO of the mid RCA 2. Patent stent in the LCx 3. Good LV function  Recommendations: Continue medical therapy.  Patient Profile     62 y.o. male with past medical history of CAD (s/p DES to RCA in 2006, DES to LCx in 2012 with CTO of RCA noted at that time, patent stent by catheterization in 07/2014), PAF (on Xarelto),  HTN, HLD, and continued tobacco use who presented to [facility] ED on 07/05/2017 for evaluation of chest pain.   Assessment & Plan    1.  Chest pain with largely atypical features.  No clear evidence of ACS with negative troponin I levels.  ECG is consistent with previous inferior infarct.  He does have PVCs which he may be feeling.  No recurrent atrial fibrillation however.  Otherwise reports compliance with his medications.  2.  CAD status post DES to the RCA in 2006 with subsequent DES to the circumflex in 2012 and documentation of RCA CTO at that time which was managed medically.  3.  Paroxysmal atrial fibrillation, currently in sinus rhythm.  He is on Xarelto and Lopressor as an outpatient.  4.  Tobacco abuse.  Has not been able to quit.  5.  COPD.  6.  Mixed hyperlipidemia, on Lipitor.  Patient undergoing echocardiogram currently and is scheduled for a [place] today as well for ischemic surveillance.  No change in current medical regimen for now.  Although symptoms atypical, if significant ischemia documented by Myoview, would consider cardiac catheterization.  Otherwise if findings are consistent with inferior scar consistent with his history, would plan to continue medical therapy.  Satira Sark, M.D., F.A.C.C.

## 2017-07-06 NOTE — Progress Notes (Signed)
*  PRELIMINARY RESULTS* Echocardiogram 2D Echocardiogram has been performed.  Leavy Cella 07/06/2017, 9:33 AM

## 2017-07-06 NOTE — Care Management Obs Status (Signed)
[place] NOTIFICATION   Patient Details  Name: [name] MRN: [id number] Date of Birth: January 27, 1956   Medicare Observation Status Notification Given:  Yes    Alvah Lagrow, Chauncey [place], RN 07/06/2017, 3:16 PM

## 2017-07-06 NOTE — Progress Notes (Signed)
PROGRESS NOTE  [name] PNT:[id number] DOB: 03/03/1956 DOA: 07/05/2017 PCP: Rory Percy, MD  Brief History:  62 year old male with a history of atrial fibrillation, COPD, tobacco abuse, coronary artery disease with DES 2006, hyperlipidemia, hypertension presenting with 2 to 3-day history of chest discomfort that was substernal in nature.  Patient states that his chest discomfort usually lasts up to 5 seconds, and is sharp in nature.  He states that it usually occurs at rest although he has had some dyspnea on exertion over the past 2 weeks.  He continues to smoke 1/2 pack/day, but has 80-pack-year history.  He denies any fever, chills, cough, hemoptysis, nausea, vomiting, diarrhea, abdominal pain, dysuria.  Cardiology was consulted to assist with management.  Assessment/Plan: Chest pain -Mostly atypical by history, although patient has had some dyspnea on exertion -Cardiology consult appreciated -Echocardiogram -troponin neg x 4 -Nuclear medicine stress test planned -Personally reviewed EKG--sinus rhythm, no ST-T wave changes -Personally reviewed chest x-ray--hyperinflation  Coronary artery disease -prior drug-eluting stent to RCA in 2006, DES to LCX in 2012 -Continue aspirin and statin  Essential hypertension -Continue metoprolol tartrate  Paroxysmal atrial fibrillation -Continue rivaroxaban -Continue metoprolol tartrate -Patient has had episodes of tachycardia as well as bradycardia -Cardiology plans eventual referral to EP  Tobacco abuse -Tobacco cessation discussed  COPD -Stable on room air -Continue Pulmicort and Spiriva  Mixed hyperlipidemia -Continue Lovenox and statin   Disposition Plan:   Home 4/24 if cleared by cardiology Family Communication:   No Family at bedside  Consultants:  cardiology  Code Status:  FULL  DVT Prophylaxis: Rivaroxaban   Procedures: As Listed in Progress Note  Above  Antibiotics: None    Subjective: Patient continues to have intermittent chest discomfort that was substernal in nature and sharp lasting up to 5 seconds through the evening.  He denied any fevers, chills, cough, hemoptysis, nausea, vomiting, diarrhea, abdominal pain, dysuria, hematuria.  He denied any shortness of breath.  Objective: Vitals:   07/05/17 1757 07/05/17 2043 07/05/17 2106 07/06/17 0527  BP: (!) 143/98 135/85  131/85  Pulse: 65 (!) 58  (!) 53  Resp: 20     Temp: 98.7 F (37.1 C) 98.2 F (36.8 C)  97.9 F (36.6 C)  TempSrc: Oral Oral  Oral  SpO2: 97% 94% 94% 92%  Weight: 98.5 kg (217 lb 3.2 oz)     Height: 5\' 10"  (1.778 m)      No intake or output data in the 24 hours ending 07/06/17 0742 Weight change:  Exam:   General:  Pt is alert, follows commands appropriately, not in acute distress  HEENT: No icterus, No thrush, No neck mass, [place]/AT  Cardiovascular: RRR, S1/S2, no rubs, no gallops  Respiratory: Diminished breath sounds.  No wheezing.  Good air movement.  Abdomen: Soft/+BS, non tender, non distended, no guarding  Extremities: No edema, No lymphangitis, No petechiae, No rashes, no synovitis   Data Reviewed: I have personally reviewed following labs and imaging studies Basic Metabolic Panel: Recent Labs  Lab 07/05/17 1301 07/06/17 0453  NA 142 138  K 3.8 3.8  CL 105 105  CO2 26 23  GLUCOSE 94 98  BUN 15 17  CREATININE 0.87 0.75  CALCIUM 8.9 8.6*   Liver Function Tests: No results for input(s): AST, ALT, ALKPHOS, BILITOT, PROT, ALBUMIN in the last 168 hours. No results for input(s): LIPASE, AMYLASE in the last 168 hours. No results for input(s): AMMONIA in  the last 168 hours. Coagulation Profile: Recent Labs  Lab 07/05/17 1301  INR 1.37   CBC: Recent Labs  Lab 07/05/17 1301 07/06/17 0453  WBC 12.4* 13.9*  HGB 16.8 16.4  HCT 49.2 49.4  MCV 91.8 94.1  PLT 296 305   Cardiac Enzymes: Recent Labs  Lab 07/05/17 1301  07/05/17 1855 07/05/17 2108 07/06/17 0016  TROPONINI <0.03 <0.03 <0.03 <0.03   BNP: Invalid input(s): POCBNP CBG: No results for input(s): GLUCAP in the last 168 hours. HbA1C: No results for input(s): HGBA1C in the last 72 hours. Urine analysis: No results found for: COLORURINE, APPEARANCEUR, LABSPEC, PHURINE, GLUCOSEU, HGBUR, BILIRUBINUR, KETONESUR, PROTEINUR, UROBILINOGEN, NITRITE, LEUKOCYTESUR Sepsis Labs: @LABRCNTIP (procalcitonin:4,lacticidven:4) )No results found for this or any previous visit (from the past 240 hour(s)).   Scheduled Meds: . albuterol  2.5 mg Nebulization QID  . aspirin EC  81 mg Oral Daily  . atorvastatin  80 mg Oral q1800  . budesonide (PULMICORT) nebulizer solution  0.25 mg Nebulization BID  . guaiFENesin  600 mg Oral BID  . loratadine  10 mg Oral Daily  . metoprolol tartrate  25 mg Oral BID  . multivitamin with minerals  1 tablet Oral Daily  . omega-3 acid ethyl esters  1 g Oral BID  . rivaroxaban  20 mg Oral Q supper  . tiotropium  18 mcg Inhalation Daily   Continuous Infusions:  Procedures/Studies: Dg Chest 2 View  Result Date: 07/05/2017 CLINICAL DATA:  Left-sided chest pain and shortness of Breath EXAM: CHEST - 2 VIEW COMPARISON:  01/26/2017 FINDINGS: The heart size and mediastinal contours are within normal limits. Both lungs are mildly hyperinflated. The visualized skeletal structures are unremarkable. IMPRESSION: COPD without acute abnormality. Electronically Signed   By: Inez Catalina M.D.   On: 07/05/2017 13:33    Orson Eva, DO  [facility] Pager [phone]  If 7PM-7AM, please contact night-coverage [email or web address] Password [email or web address] 07/06/2017, 7:42 AM   LOS: 0 days

## 2017-07-06 NOTE — Progress Notes (Signed)
[name] discharged Home per MD order.  Discharge instructions reviewed and discussed with the patient, all questions and concerns answered. Copy of instructions given to patient.  Allergies as of 07/06/2017   No Known Allergies     Medication List    TAKE these medications   acetaminophen 500 MG tablet Commonly known as:  TYLENOL Take 1,000 mg by mouth every 4 (four) hours as needed for moderate pain.   atorvastatin 80 MG tablet Commonly known as:  LIPITOR take 1 tablet by mouth once daily   ATROVENT HFA 17 MCG/ACT inhaler Generic drug:  ipratropium Inhale 2 puffs into the lungs every 6 (six) hours as needed for wheezing.   Fish Oil 1000 MG Caps Take 2,000 mg by mouth 2 (two) times daily.   guaiFENesin 600 MG 12 hr tablet Commonly known as:  MUCINEX Take 600 mg by mouth 2 (two) times daily.   loratadine 10 MG tablet Commonly known as:  CLARITIN Take 10 mg by mouth daily.   metoprolol tartrate 25 MG tablet Commonly known as:  LOPRESSOR Take 1 tablet (25 mg total) by mouth 2 (two) times daily. Take an extra pill for fast heart rate in the 120's range or for symptoms of palpitations, heart out of rhythm   multivitamin tablet Take 1 tablet by mouth daily.   nitroGLYCERIN 0.4 MG SL tablet Commonly known as:  NITROSTAT place 1 tablet under the tongue every 5 minutes for UP TO 3 doses if needed for angina as directed by prescriber   QVAR 80 MCG/ACT inhaler Generic drug:  beclomethasone Inhale 2 puffs into the lungs 2 (two) times daily.   rivaroxaban 20 MG Tabs tablet Commonly known as:  XARELTO Take 1 tablet (20 mg total) by mouth daily with supper.   SPIRIVA HANDIHALER 18 MCG inhalation capsule Generic drug:  tiotropium Place 18 mcg into inhaler and inhale daily.   VENTOLIN HFA 108 (90 Base) MCG/ACT inhaler Generic drug:  albuterol Inhale 2 puffs into the lungs every 4 (four) hours as needed for wheezing or shortness of breath.       Patients skin is  clean, dry and intact, no evidence of skin break down. IV site discontinued and catheter remains intact. Site without signs and symptoms of complications. Dressing and pressure applied.  Pt ambulated to elevator, no distress noted upon discharge.  Ralene Muskrat Mackenzie Groom 07/06/2017 4:31 PM

## 2017-07-07 ENCOUNTER — Telehealth: Payer: Self-pay | Admitting: Cardiology

## 2017-07-07 LAB — HIV ANTIBODY (ROUTINE TESTING W REFLEX): HIV Screen 4th Generation wRfx: NONREACTIVE

## 2017-07-07 NOTE — Telephone Encounter (Signed)
Would recommend taking Xarelto and NOT taking ASA as he has not required any cardiac interventions since 2012. Xarelto alone is acceptable at this time. Thank you.   Signed, Erma Heritage, PA-C 07/07/2017, 3:51 PM Pager: [phone]

## 2017-07-07 NOTE — Telephone Encounter (Signed)
Pt was admitted, was told by Dr. Domenic Polite and B. Strader not to take the 81mg  baby asprin because he was put on the Xarelto and then one of the Dr's in the hospital had told him to take it, now they're not sure what he should be taking. Please call pt's wife Novella Olive @ [phone]

## 2017-07-07 NOTE — Telephone Encounter (Signed)
Wife informed that patient does NOT need to take ASA

## 2017-07-11 ENCOUNTER — Encounter (HOSPITAL_COMMUNITY): Payer: Self-pay | Admitting: Emergency Medicine

## 2017-07-11 ENCOUNTER — Other Ambulatory Visit: Payer: Self-pay

## 2017-07-11 ENCOUNTER — Emergency Department (HOSPITAL_COMMUNITY)
Admission: EM | Admit: 2017-07-11 | Discharge: 2017-07-11 | Disposition: A | Discharge status: Home / Self Care | Payer: 59 | Attending: Emergency Medicine | Admitting: Emergency Medicine

## 2017-07-11 ENCOUNTER — Emergency Department (HOSPITAL_COMMUNITY): Payer: 59

## 2017-07-11 ENCOUNTER — Telehealth: Payer: Self-pay | Admitting: Cardiology

## 2017-07-11 DIAGNOSIS — Z79899 Other long term (current) drug therapy: Secondary | ICD-10-CM | POA: Diagnosis not present

## 2017-07-11 DIAGNOSIS — R002 Palpitations: Secondary | ICD-10-CM | POA: Insufficient documentation

## 2017-07-11 DIAGNOSIS — I10 Essential (primary) hypertension: Secondary | ICD-10-CM | POA: Diagnosis not present

## 2017-07-11 DIAGNOSIS — R0602 Shortness of breath: Secondary | ICD-10-CM | POA: Diagnosis present

## 2017-07-11 DIAGNOSIS — J449 Chronic obstructive pulmonary disease, unspecified: Secondary | ICD-10-CM | POA: Diagnosis not present

## 2017-07-11 DIAGNOSIS — F17210 Nicotine dependence, cigarettes, uncomplicated: Secondary | ICD-10-CM | POA: Insufficient documentation

## 2017-07-11 LAB — COMPREHENSIVE METABOLIC PANEL
ALT: 28 U/L (ref 17–63)
ANION GAP: 10 (ref 5–15)
AST: 16 U/L (ref 15–41)
Albumin: 3.7 g/dL (ref 3.5–5.0)
Alkaline Phosphatase: 67 U/L (ref 38–126)
BUN: 15 mg/dL (ref 6–20)
CHLORIDE: 108 mmol/L (ref 101–111)
CO2: 23 mmol/L (ref 22–32)
Calcium: 8.8 mg/dL — ABNORMAL LOW (ref 8.9–10.3)
Creatinine, Ser: 0.73 mg/dL (ref 0.61–1.24)
Glucose, Bld: 95 mg/dL (ref 65–99)
POTASSIUM: 3.7 mmol/L (ref 3.5–5.1)
Sodium: 141 mmol/L (ref 135–145)
Total Bilirubin: 0.8 mg/dL (ref 0.3–1.2)
Total Protein: 7 g/dL (ref 6.5–8.1)

## 2017-07-11 LAB — CBC WITH DIFFERENTIAL/PLATELET
BASOS PCT: 0 %
Basophils Absolute: 0 10*3/uL (ref 0.0–0.1)
EOS ABS: 0.4 10*3/uL (ref 0.0–0.7)
Eosinophils Relative: 2 %
HCT: 50.1 % (ref 39.0–52.0)
HEMOGLOBIN: 16.7 g/dL (ref 13.0–17.0)
Lymphocytes Relative: 25 %
Lymphs Abs: 3.9 10*3/uL (ref 0.7–4.0)
MCH: 31.3 pg (ref 26.0–34.0)
MCHC: 33.3 g/dL (ref 30.0–36.0)
MCV: 93.8 fL (ref 78.0–100.0)
MONOS PCT: 7 %
Monocytes Absolute: 1 10*3/uL (ref 0.1–1.0)
NEUTROS PCT: 66 %
Neutro Abs: 10 10*3/uL — ABNORMAL HIGH (ref 1.7–7.7)
Platelets: 317 10*3/uL (ref 150–400)
RBC: 5.34 MIL/uL (ref 4.22–5.81)
RDW: 14.3 % (ref 11.5–15.5)
WBC: 15.3 10*3/uL — ABNORMAL HIGH (ref 4.0–10.5)

## 2017-07-11 LAB — PROTIME-INR
INR: 1.28
PROTHROMBIN TIME: 15.9 s — AB (ref 11.4–15.2)

## 2017-07-11 LAB — CBG MONITORING, ED: GLUCOSE-CAPILLARY: 104 mg/dL — AB (ref 65–99)

## 2017-07-11 LAB — TROPONIN I

## 2017-07-11 LAB — BRAIN NATRIURETIC PEPTIDE: B NATRIURETIC PEPTIDE 5: 187 pg/mL — AB (ref 0.0–100.0)

## 2017-07-11 LAB — MAGNESIUM: MAGNESIUM: 1.8 mg/dL (ref 1.7–2.4)

## 2017-07-11 MED ORDER — ASPIRIN 81 MG PO CHEW: 324.0000 mg | CHEWABLE_TABLET | Freq: Once | ORAL | Status: DC

## 2017-07-11 NOTE — Telephone Encounter (Signed)
Patient states since coming home from hospital his HR has been running from 115-130. O2 level has been about 90%. Patient states he is very short of breath worse than normal. Patient states he has took 2 breathing treatments today and it has not helped. BP this morning was 127/91. Patient states he has been sitting in the chair all day with little to no activity. Patient states his heart just keeps racing. Patient is also now experiencing tingling in L hand. Patient has not experienced any chest pain. HR started elevating Saturday 4/27 and has not went down. Will forward to provider for further recommendation.

## 2017-07-11 NOTE — Telephone Encounter (Signed)
Unfortunately, this probably means he needs to be seen in the ER again.  He was not in atrial fibrillation during his hospital stay last week, ruled out for ACS, and had overall reassuring ischemic testing.  My biggest concern is that he is back in rapid atrial fibrillation leading to his shortness of breath, and he may in fact need to be cardioverted.

## 2017-07-11 NOTE — ED Provider Notes (Signed)
[facility] EMERGENCY DEPARTMENT Provider Note   CSN: [id number] Arrival date & time: 07/11/17  1610     History   Chief Complaint Chief Complaint  Patient presents with  . Shortness of Breath    HPI [name] is a 62 y.o. male.  HPI Presents with his wife who assists with the HPI. He arrives due to palpitations, dyspnea. He notes that these are similar symptoms to those he expressed last week prior to ED presentation, and admission. Today, he also had soreness in both shoulders and numbness in his left hand, though these have improved. This episode began about 3 days ago, and since onset is been persistent, with though with waxing, waning severity. Today, after symptoms were increasingly bothersome, and he had the aforementioned numbness in his hand he took an extra dose of his beta-blocker. He notes that now, his heart rate seems more normal, his numbness has improved, and he has no shoulder tenderness. Throughout this illness no nausea, vomiting, fever, chills, cough. Patient is a smoker, though he notes he is trying to quit. He acknowledges a history of COPD as well as atrial fibrillation.  Past Medical History:  Diagnosis Date  . Asthma   . Atrial fibrillation ([place])   . Cancer (HCC)    Skin  . COPD (chronic obstructive pulmonary disease) ([place])   . Coronary atherosclerosis of native coronary artery    a. s/p DES to RCA in 2006 b. DES to LCx in 2012 with CTO of RCA noted at that time  . Hypertension   . Mixed hyperlipidemia   . Myocardial infarction ([place])    NSTEMI 4/12  . Seasonal allergies     Patient Active Problem List   Diagnosis Date Noted  . Atypical chest pain 07/05/2017  . PAF (paroxysmal atrial fibrillation) ([place]) 07/05/2017  . COPD (chronic obstructive pulmonary disease) ([place]) 07/05/2017  . Angina pectoris syndrome ([place]) 07/26/2014  . Coronary artery disease involving native coronary artery of native heart with angina pectoris ([place]) 07/07/2010   . Mixed hyperlipidemia 07/07/2010  . Tobacco use disorder 07/07/2010  . Essential hypertension, benign 07/07/2010  . Obesity 07/07/2010    Past Surgical History:  Procedure Laterality Date  . CARDIAC CATHETERIZATION N/A 07/26/2014   Procedure: Left Heart Cath and Coronary Angiography;  Surgeon: Peter M Martinique, MD;  Location: [place] [facility];  Service: Cardiovascular;  Laterality: N/A;  . CYSTOSCOPY  01/31/2013  . Nasal reconstruction surgery          Home Medications    Prior to Admission medications   Medication Sig Start Date End Date Taking? Authorizing Provider  acetaminophen (TYLENOL) 500 MG tablet Take 1,000 mg by mouth every 4 (four) hours as needed for moderate pain.     [provider]  atorvastatin (LIPITOR) 80 MG tablet take 1 tablet by mouth once daily 11/08/16   Satira Sark, MD  ATROVENT [facility] 17 MCG/ACT inhaler Inhale 2 puffs into the lungs every 6 (six) hours as needed for wheezing.  06/07/14   [provider]  guaiFENesin (MUCINEX) 600 MG 12 hr tablet Take 600 mg by mouth 2 (two) times daily.     [provider]  loratadine (CLARITIN) 10 MG tablet Take 10 mg by mouth daily.      [provider]  metoprolol tartrate (LOPRESSOR) 25 MG tablet Take 1 tablet (25 mg total) by mouth 2 (two) times daily. Take an extra pill for fast heart rate in the 120's range or  for symptoms of palpitations, heart out of rhythm 06/24/17   Satira Sark, MD  Multiple Vitamin (MULTIVITAMIN) tablet Take 1 tablet by mouth daily.      [provider]  nitroGLYCERIN (NITROSTAT) 0.4 MG SL tablet place 1 tablet under the tongue every 5 minutes for UP TO 3 doses if needed for angina as directed by prescriber 04/01/17   Satira Sark, MD  Omega-3 Fatty Acids (FISH OIL) 1000 MG CAPS Take 2,000 mg by mouth 2 (two) times daily.     [provider]  QVAR 80 MCG/ACT inhaler Inhale 2 puffs into the lungs 2 (two) times daily.  07/01/14    [provider]  rivaroxaban (XARELTO) 20 MG TABS tablet Take 1 tablet (20 mg total) by mouth daily with supper. 06/01/17   Strader, Fransisco Hertz, PA-C  SPIRIVA HANDIHALER 18 MCG inhalation capsule Place 18 mcg into inhaler and inhale daily.  07/09/14   [provider]  VENTOLIN HFA 108 (90 BASE) MCG/ACT inhaler Inhale 2 puffs into the lungs every 4 (four) hours as needed for wheezing or shortness of breath.  07/09/14   [provider]    Family History Family History  Problem Relation Age of Onset  . Coronary artery disease Unknown   . Cancer Mother   . Diabetes Mother   . Heart disease Mother   . COPD Father   . High blood pressure Father   . Heart disease Father     Social History Social History   Tobacco Use  . Smoking status: Current Every Day Smoker    Packs/day: 1.00    Years: 30.00    Pack years: 30.00    Types: Cigarettes    Start date: 03/12/1970  . Smokeless tobacco: Never Used  Substance Use Topics  . Alcohol use: No    Alcohol/week: 0.0 oz  . Drug use: No     Allergies   Patient has no known allergies.   Review of Systems Review of Systems  Constitutional:       Per HPI, otherwise negative  HENT:       Per HPI, otherwise negative  Respiratory:       Per HPI, otherwise negative  Cardiovascular:       Per HPI, otherwise negative  Gastrointestinal: Negative for vomiting.  Endocrine:       Negative aside from HPI  Genitourinary:       Neg aside from HPI   Musculoskeletal:       Per HPI, otherwise negative  Skin: Negative.   Neurological: Negative for syncope.     Physical Exam Updated Vital Signs BP 121/79   Pulse (!) 38   Temp 97.9 F (36.6 C)   Resp (!) 22   Wt 97.5 kg (215 lb)   SpO2 97%   BMI 30.85 kg/m   Physical Exam  Constitutional: He is oriented to person, place, and time. He appears well-developed. No distress.  HENT:  Head: Normocephalic and atraumatic.  Eyes: Conjunctivae and EOM are normal.    Cardiovascular: Normal rate and regular rhythm.  Pulmonary/Chest: Effort normal. No stridor. No respiratory distress.  Abdominal: He exhibits no distension.  Musculoskeletal: He exhibits no edema.  Neurological: He is alert and oriented to person, place, and time.  Skin: Skin is warm and dry.  Psychiatric: He has a normal mood and affect.  Nursing note and vitals reviewed.    ED Treatments / Results  Labs (all labs ordered are listed, but only  abnormal results are displayed) Labs Reviewed  COMPREHENSIVE METABOLIC PANEL - Abnormal; Notable for the following components:      Result Value   Calcium 8.8 (*)    All other components within normal limits  BRAIN NATRIURETIC PEPTIDE - Abnormal; Notable for the following components:   B Natriuretic Peptide 187.0 (*)    All other components within normal limits  CBC WITH DIFFERENTIAL/PLATELET - Abnormal; Notable for the following components:   WBC 15.3 (*)    Neutro Abs 10.0 (*)    All other components within normal limits  PROTIME-INR - Abnormal; Notable for the following components:   Prothrombin Time 15.9 (*)    All other components within normal limits  CBG MONITORING, ED - Abnormal; Notable for the following components:   Glucose-Capillary 104 (*)    All other components within normal limits  MAGNESIUM  TROPONIN I    EKG EKG Interpretation  Date/Time:  Monday July 11 2017 16:24:20 EDT Ventricular Rate:  78 PR Interval:    QRS Duration: 90 QT Interval:  375 QTC Calculation: 428 R Axis:   5 Text Interpretation:  Sinus rhythm Multiple ventricular premature complexes Consider left atrial enlargement Inferior infarct, old Baseline wander in lead(s) V1 Confirmed by Carmin Muskrat [phone]) on 07/11/2017 4:59:09 PM   Radiology Dg Chest Portable 1 View  Result Date: 07/11/2017 CLINICAL DATA:  Shortness of breath for 3 days. LEFT hand numbness and tingling for 2 hours. EXAM: PORTABLE CHEST 1 VIEW COMPARISON:  Chest radiograph  July 05, 2017 FINDINGS: Cardiac silhouette is mildly enlarged and unchanged. Mediastinal silhouette is nonsuspicious. Mild chronic interstitial changes and hyperinflation without focal consolidation or pleural effusion. No pneumothorax. Soft tissue planes and included osseous structures are nonsuspicious. IMPRESSION: Mild cardiomegaly.  COPD. Electronically Signed   By: Elon Alas M.D.   On: 07/11/2017 16:42    Procedures Procedures (including critical care time)  Medications Ordered in ED Medications  aspirin chewable tablet 324 mg (0 mg Oral Hold 07/11/17 1638)     Initial Impression / Assessment and Plan / ED Course  I have reviewed the triage vital signs and the nursing notes.  Pertinent labs & imaging results that were available during my care of the patient were reviewed by me and considered in my medical decision making (see chart for details).   After the initial evaluation I reviewed the patient's chart including documentation of stress test performed during hospitalization last week. Results of stress test as below:  Myocardial perfusion is abnormal. Findings consistent with prior myocardial infarction with peri-infarct ischemia. This is a low risk study. Overall left ventricular systolic function was normal. LV cavity size is normal. The left ventricular ejection fraction is normal (55-65%).    6:17 PM On repeat exam the patient is awake, alert, in no distress, he remains hemodynamically unremarkable, with sinus rhythm, in the 70s on monitor. Over hours of monitoring he has had similar absence of substantial arrhythmia, nor suspicion for paroxysmal atrial fibrillation contributing to his symptoms, no evidence for ongoing ischemia, and given his recent reassuring perfusion study, EKG today, normal troponin, the patient is appropriate for discharge. Discussed the importance of following up with electrophysiology this week. Patient will take additional beta-blocker dosage  pending that follow-up.   Final Clinical Impressions(s) / ED Diagnoses   Final diagnoses:  Shortness of breath  Palpitations     Carmin Muskrat, MD 07/11/17 1818

## 2017-07-11 NOTE — ED Notes (Signed)
Denies palpitations at present, + sob per pt., + tingling to left hand.

## 2017-07-11 NOTE — Telephone Encounter (Signed)
Patient notified and verbalized understanding. Patient very appreciative of the promptness of Dr. Domenic Polite returning message.

## 2017-07-11 NOTE — ED Triage Notes (Signed)
Patient complaining of tachycardia and shortness of breath x 3 days. Also complaining of "numbness and tingling" to left hand x 2 hours today. States he also feels "disoriented." Patient alert and ambulatory at triage.

## 2017-07-11 NOTE — Discharge Instructions (Addendum)
Discussed, today's evaluation has been reassuring. Your symptoms are likely due to a recurrence of your atrial fibrillation. Please begin taking 50 mg of Lopressor, twice daily.  Tomorrow, please be sure to call your cardiology clinic, and schedule a follow-up visit with the electrophysiology team.  Return here for concerning changes in your condition.

## 2017-07-11 NOTE — Telephone Encounter (Signed)
Patient was at [facility] last week . Called stating that his heart rate continues to stay high with shortness of breath.  Please call [phone].

## 2017-07-11 NOTE — ED Notes (Signed)
ED Provider at bedside. 

## 2017-07-12 ENCOUNTER — Telehealth: Payer: Self-pay | Admitting: Cardiology

## 2017-07-12 NOTE — Telephone Encounter (Signed)
Pt was concerned with taking 50 mg bid of Lopressor as per d/c summary from ED - pt has upcoming appt with Dr Rayann Heman 5/2 and advised him that he should follow hospital instructions (seen for SOB/afib) pt agreeable and voiced understanding

## 2017-07-12 NOTE — Telephone Encounter (Signed)
Patient recently in ED   Wants to go over medication changes with Dr Domenic Polite before doing what ER doctor advice

## 2017-07-13 ENCOUNTER — Encounter: Payer: Self-pay | Admitting: Internal Medicine

## 2017-07-14 ENCOUNTER — Telehealth: Payer: Self-pay | Admitting: *Deleted

## 2017-07-14 ENCOUNTER — Ambulatory Visit: Payer: 59 | Admitting: Internal Medicine

## 2017-07-14 ENCOUNTER — Encounter: Payer: Self-pay | Admitting: Internal Medicine

## 2017-07-14 VITALS — BP 116/86 | HR 60 | Ht 70.0 in | Wt 217.0 lb

## 2017-07-14 DIAGNOSIS — R002 Palpitations: Secondary | ICD-10-CM

## 2017-07-14 DIAGNOSIS — R0683 Snoring: Secondary | ICD-10-CM

## 2017-07-14 DIAGNOSIS — I480 Paroxysmal atrial fibrillation: Secondary | ICD-10-CM

## 2017-07-14 DIAGNOSIS — R0600 Dyspnea, unspecified: Secondary | ICD-10-CM

## 2017-07-14 MED ORDER — METOPROLOL TARTRATE 25 MG PO TABS: 25.0000 mg | ORAL_TABLET | Freq: Two times a day (BID) | ORAL | 3 refills | Status: DC

## 2017-07-14 MED ORDER — DRONEDARONE HCL 400 MG PO TABS: 400.0000 mg | ORAL_TABLET | Freq: Two times a day (BID) | ORAL | 11 refills | Status: DC

## 2017-07-14 NOTE — Progress Notes (Signed)
Electrophysiology Office Note   Date:  07/14/2017   ID:  Nicki, Gracy [birth year], MRN [id number]  PCP:  Rory Percy, MD  Cardiologist:  Dr Domenic Polite Primary Electrophysiologist: Thompson Grayer, MD    Chief Complaint  Patient presents with  . Paroxysmal Atrial fibrillation  . PVC     History of Present Illness: [name] is a 62 y.o. male who presents today for electrophysiology evaluation.   He is referred by Dr Domenic Polite for EP consultation regarding atrial fibrillation.  The patient has a a h/o COPD, CAD (s/p prior PCI) and ongoing tobacco use.  He is active.  He reports being diagnosed with Afib 01/26/17 after presenting to [facility] with palpitations and fatigue.  He was placed on xarelto and his metoprolol was increased.  He has had increasing frequency and duration of afib since that time.  He reports tachypalpitations and fatigue with afib.  He has had multiple ER visits.  He has not yet tried AAD therapy. He snores and is sleepy at times during the day.  No prior sleep study.  Today, he denies symptoms of palpitations, chest pain, shortness of breath, orthopnea, PND, lower extremity edema, claudication, dizziness, presyncope, syncope, bleeding, or neurologic sequela. The patient is tolerating medications without difficulties and is otherwise without complaint today.    Past Medical History:  Diagnosis Date  . Asthma   . Cancer (HCC)    Skin  . COPD (chronic obstructive pulmonary disease) ([place])   . Coronary atherosclerosis of native coronary artery    a. s/p DES to RCA in 2006 b. DES to LCx in 2012 with CTO of RCA noted at that time  . Hypertension   . Mixed hyperlipidemia   . Myocardial infarction ([place])    NSTEMI 4/12  . Paroxysmal atrial fibrillation (HCC)   . Seasonal allergies    Past Surgical History:  Procedure Laterality Date  . CARDIAC CATHETERIZATION N/A 07/26/2014   Procedure: Left Heart Cath and Coronary Angiography;  Surgeon: Peter M  Martinique, MD;  Location: [facility];  Service: Cardiovascular;  Laterality: N/A;  . CYSTOSCOPY  01/31/2013  . Nasal reconstruction surgery       Current Outpatient Medications  Medication Sig Dispense Refill  . acetaminophen (TYLENOL) 500 MG tablet Take 1,000 mg by mouth every 4 (four) hours as needed for moderate pain.     [name] atorvastatin (LIPITOR) 80 MG tablet take 1 tablet by mouth once daily 30 tablet 11  . ATROVENT HFA 17 MCG/ACT inhaler Inhale 2 puffs into the lungs every 6 (six) hours as needed for wheezing.   0  . guaiFENesin (MUCINEX) 600 MG 12 hr tablet Take 600 mg by mouth 2 (two) times daily.     [name] loratadine (CLARITIN) 10 MG tablet Take 10 mg by mouth daily.      . metoprolol tartrate (LOPRESSOR) 25 MG tablet Take 50 mg by mouth 2 (two) times daily.    . Multiple Vitamin (MULTIVITAMIN) tablet Take 1 tablet by mouth daily.      . nitroGLYCERIN (NITROSTAT) 0.4 MG SL tablet place 1 tablet under the tongue every 5 minutes for UP TO 3 doses if needed for angina as directed by prescriber 25 tablet 3  . Omega-3 Fatty Acids (FISH OIL) 1000 MG CAPS Take 2,000 mg by mouth 2 (two) times daily.     [name] QVAR 80 MCG/ACT inhaler Inhale 2 puffs into the lungs 2 (two) times daily.   1  .  rivaroxaban (XARELTO) 20 MG TABS tablet Take 1 tablet (20 mg total) by mouth daily with supper. 30 tablet 11  . SPIRIVA HANDIHALER 18 MCG inhalation capsule Place 18 mcg into inhaler and inhale daily.   1  . VENTOLIN HFA 108 (90 BASE) MCG/ACT inhaler Inhale 2 puffs into the lungs every 4 (four) hours as needed for wheezing or shortness of breath.   1   No current facility-administered medications for this visit.     Allergies:   Patient has no known allergies.   Social History:  The patient  reports that he has been smoking cigarettes.  He started smoking about 47 years ago. He has a 30.00 pack-year smoking history. He has never used smokeless tobacco. He reports that he does not drink alcohol or use drugs.    Family History:  The patient's  family history includes COPD in his father; Cancer in his mother; Coronary artery disease in his unknown relative; Diabetes in his mother; Heart disease in his father and mother; High blood pressure in his father.    ROS:  Please see the history of present illness.   All other systems are personally reviewed and negative.    PHYSICAL EXAM: VS:  BP 116/86   Pulse 60   Ht 5\' 10"  (1.778 m)   Wt 217 lb (98.4 kg)   BMI 31.14 kg/m  , BMI Body mass index is 31.14 kg/m. GEN: Well nourished, well developed, in no acute distress  HEENT: normal  Neck: no JVD, carotid bruits, or masses Cardiac: RRR; no murmurs, rubs, or gallops,no edema  Respiratory:  clear to auscultation bilaterally, normal work of breathing GI: soft, nontender, nondistended, + BS MS: no deformity or atrophy  Skin: warm and dry  Neuro:  Strength and sensation are intact Psych: euthymic mood, full affect  EKG:  EKG is ordered today. The ekg ordered today is personally reviewed and shows sinus rhythm 60 bpm, PR 204 msec, QTc 410 msec, QRS 84 msec, inferior infarct   Recent Labs: 06/01/2017: TSH 1.401 07/11/2017: ALT 28; B Natriuretic Peptide 187.0; BUN 15; Creatinine, Ser 0.73; Hemoglobin 16.7; Magnesium 1.8; Platelets 317; Potassium 3.7; Sodium 141  personally reviewed   Lipid Panel  No results found for: CHOL, TRIG, HDL, CHOLHDL, VLDL, LDLCALC, LDLDIRECT personally reviewed   Wt Readings from Last 3 Encounters:  07/14/17 217 lb (98.4 kg)  07/11/17 215 lb (97.5 kg)  07/05/17 217 lb 3.2 oz (98.5 kg)    Other studies personally reviewed: Additional studies/ records that were reviewed today include: echo 07/06/17  Review of the above records today demonstrates: EF 60%, basal inferior HK, mild AI, LA 37 mm  ASSESSMENT AND PLAN:  1.  Paroxysmal atrial fibrillation The patient has symptomatic atrial fibrillation He has not previously tried AAD therapy. Therapeutic strategies for  afib including medicine (multaq, sotalol, tikosyn, amiodarone) and ablation were discussed in detail with the patient today. Risk, benefits, and alternatives to EP study and radiofrequency ablation for afib were also discussed in detail today.  At this time, he would prefer to try medical therapy. I will therefore start multaq 400mg  BID Reduce metoprolol to 25mg  BID (he thinks that recent increased dose has made him "grumpy".  He is instructed to take additional metoprolol 25mg  prn afib. chads2vasc score is 1-2 (CAD, ? HTN).  Continue on xarelto. If he fails medical therapy, would have a low threshold to consider ablation.  2. Snoring/ fatigue Refer for sleep study  3. CAD No ischemic symptoms  No change  4. Tobacco cessation advised  Follow-up:  Return to see me in 6 weeks He is instructed to contact my office with any problems in the interim.  Current medicines are reviewed at length with the patient today.   The patient does not have concerns regarding his medicines.  The following changes were made today:  none  Labs/ tests ordered today include:  Orders Placed This Encounter  Procedures  . EKG 12-Lead     Signed, Thompson Grayer, MD  07/14/2017 10:27 AM     [facility] [address] [place] [address] [phone] (office) [phone] (fax)

## 2017-07-14 NOTE — Patient Instructions (Addendum)
Medication Instructions:  Your physician has recommended you make the following change in your medication:  1.  Decrease your metoprolol 25 mg to one tablet by mouth twice a day. 2.  Start Multaq 400 mg one tablet by mouth twice a day TAKE with FOOD.  Labwork: None ordered.  Testing/Procedures: Your physician has recommended that you have a sleep study. This test records several body functions during sleep, including: brain activity, eye movement, oxygen and carbon dioxide blood levels, heart rate and rhythm, breathing rate and rhythm, the flow of air through your mouth and nose, snoring, body muscle movements, and chest and belly movement.  Follow-Up: Your physician wants you to follow-up in: 6 weeks with Dr. Rayann Heman.     Any Other Special Instructions Will Be Listed Below (If Applicable).  If you need a refill on your cardiac medications before your next appointment, please call your pharmacy.   Dronedarone tablets What is this medicine? DRONEDARONE (droe NE da rone) is an antiarrhythmic drug. It helps make your heart beat regularly. This medicine may be used for other purposes; ask your health care provider or pharmacist if you have questions. COMMON BRAND NAME(S): Multaq What should I tell my health care provider before I take this medicine? They need to know if you have any of these conditions: -heart failure -history of irregular heartbeat -liver disease -liver or lung problems with the past use of amiodarone -low levels of magnesium in the blood -low levels of potassium in the blood -other heart disease -an unusual or allergic reaction to dronedarone, other medicines, foods, dyes, or preservatives -pregnant or trying to get pregnant -breast-feeding How should I use this medicine? Take this medicine by mouth with a glass of water. Follow the directions on the prescription label. Take one tablet with the morning meal and one tablet with the evening meal. Do not take your  medicine more often than directed. Do not stop taking except on the advice of your doctor or health care professional. A special MedGuide will be given to you by the pharmacist with each prescription and refill. Be sure to read this information carefully each time. Talk to your pediatrician regarding the use of this medicine in children. Special care may be needed. Overdosage: If you think you have taken too much of this medicine contact a poison control center or emergency room at once. NOTE: This medicine is only for you. Do not share this medicine with others. What if I miss a dose? If you miss a dose, take it as soon as you can. If it is almost time for your next dose, take only that dose. Do not take double or extra doses. What may interact with this medicine? Do not take this medicine with any of the following medications: -arsenic trioxide -certain antibiotics like clarithromycin, erythromycin, pentamidine, telithromycin, troleandomycin -certain medicines for depression like tricyclic antidepressants -certain medicines for fungal infections like fluconazole, itraconazole, ketoconazole, posaconazole, voriconazole -certain medicines for irregular heart beat like amiodarone, disopyramide, dofetilide, flecainide, ibutilide, quinidine, propafenone, sotalol -certain medicines for malaria like chloroquine, halofantrine -cisapride -cyclosporine -droperidol -haloperidol -methadone -other medicines that prolong the QT interval (cause an abnormal heart rhythm) -pimozide -nefazodone -phenothiazines like chlorpromazine, mesoridazine, prochlorperazine, thioridazine -ritonavir -ziprasidone This medicine may also interact with the following medications: -certain medicines for blood pressure, heart disease, or irregular heart beat like diltiazem, metoprolol, propranolol, verapamil -certain medicines for cholesterol like atorvastatin, lovastatin, simvastatin -certain medicines for seizures like  carbamazepine, phenobarbital, phenytoin -digoxin -grapefruit juice -rifampin -sirolimus -St.  John's Wort -tacrolimus This list may not describe all possible interactions. Give your health care provider a list of all the medicines, herbs, non-prescription drugs, or dietary supplements you use. Also tell them if you smoke, drink alcohol, or use illegal drugs. Some items may interact with your medicine. What should I watch for while using this medicine? Your condition will be monitored closely when you first begin therapy. Often, this drug is first started in a hospital or other monitored health care setting. Once you are on maintenance therapy, visit your doctor or health care professional for regular checks on your progress. Because your condition and use of this medicine carry some risk, it is a good idea to carry an identification card, necklace or bracelet with details of your condition, medications, and doctor or health care professional. [name] may get drowsy or dizzy. Do not drive, use machinery, or do anything that needs mental alertness until you know how this medicine affects you. Do not stand or sit up quickly, especially if you are an older patient. This reduces the risk of dizzy or fainting spells. What side effects may I notice from receiving this medicine? Side effects that you should report to your doctor or health care professional as soon as possible: -allergic reactions like skin rash, itching or hives, swelling of the face, lips, or tongue -breathing problems -cough -dark urine -fast, irregular heartbeat -general ill feeling or flu-like symptoms -light-colored stools -loss of appetite, nausea -right upper belly pain -slow heartbeat -stomach pain -swelling of the legs or ankles -unusually weak or tired -weight gain -yellowing of the eyes or skin Side effects that usually do not require medical attention (report to your doctor or health care professional if they continue or  are bothersome): -nausea -vomiting -stomach pain This list may not describe all possible side effects. Call your doctor for medical advice about side effects. You may report side effects to FDA at 1-800-FDA-1088. Where should I keep my medicine? Keep out of the reach of children. Store at room temperature between 15 and 30 degrees C (59 and 86 degrees F). Throw away any unused medicine after the expiration date. NOTE: This sheet is a summary. It may not cover all possible information. If you have questions about this medicine, talk to your doctor, pharmacist, or health care provider.  2018 Elsevier/Gold Standard (2015-04-03 12:43:06)

## 2017-07-14 NOTE — Telephone Encounter (Signed)
-----   Message from Damian Leavell, RN sent at 07/14/2017 10:46 AM EDT ----- Regarding: new referral for sleep study-Traci Turner Pt being referred for sleep study by Dr. Rayann Heman for snoring  ESS total-9  Thank you  Sonia Baller

## 2017-07-15 ENCOUNTER — Telehealth: Payer: Self-pay | Admitting: *Deleted

## 2017-07-15 ENCOUNTER — Telehealth: Payer: Self-pay

## 2017-07-15 NOTE — Telephone Encounter (Signed)
Sent to precert 

## 2017-07-15 NOTE — Telephone Encounter (Signed)
PA submitted to [employer] for in lab sleep study. 

## 2017-07-15 NOTE — Telephone Encounter (Signed)
**Note De-Identified Daleisa Halperin Obfuscation** Per letter received Brydan Downard fax from [facility] is not covered under the pts plan. The preferred alternative is Amiodarone.  Please advise.

## 2017-07-16 NOTE — Telephone Encounter (Signed)
Needs multaq, not amiodarone Consider medical exception appeal if necessary

## 2017-07-18 NOTE — Telephone Encounter (Signed)
**Note De-Identified Graycen Degan Obfuscation** Approval on Multaq PA received Avishai Reihl fax from [place]. Approval good until 07/19/2018 [id number]  I have notified the pts pharmacy.

## 2017-07-18 NOTE — Telephone Encounter (Signed)
I have done a Multaq PA through covermymeds. Key: [id number]

## 2017-07-19 ENCOUNTER — Telehealth: Payer: Self-pay | Admitting: *Deleted

## 2017-07-19 NOTE — Telephone Encounter (Signed)
Staff message sent to [facility] approval received from [facility]. Ok to schedule sleep study.

## 2017-07-21 NOTE — Telephone Encounter (Signed)
  Lauralee Evener, CMA  Freada Bergeron, CMA        Received approval from [facility]. Ok to schedule sleep study.     ----- Message -----  From: Freada Bergeron, CMA  Sent: 07/14/2017  4:22 PM  To: Lauralee Evener, CMA  Subject: RE: new referral for sleep study-Traci Turner   Had to lmtcb. Will call again in the morning.  ----- Message -----  From: Lauralee Evener, CMA  Sent: 07/14/2017  4:07 PM  To: Freada Bergeron, CMA, Damian Leavell, RN  Subject: RE: new referral for sleep study-Traci Damaris Schooner please call patient to see if his insurance has changed. everytime I enter his member ID and DOB it says member not found.  ----- Message -----  From: Damian Leavell, RN  Sent: 07/14/2017 10:46 AM  To: Freada Bergeron, CMA, [facility] Sleep [facility]  Subject: new referral for sleep study-Traci Turner     Pt being referred for sleep study by Dr. Rayann Heman for snoring   ESS total-9

## 2017-07-21 NOTE — Telephone Encounter (Signed)
Patient is scheduled for lab study on Wednesday 08/10/2017. Patient understands his sleep study will be done at [place] lab. Patient understands he will receive a sleep packet in a week or so. Patient understands to call if he does not receive the sleep packet in a timely manner. Patient agrees with treatment and thanked me for call.

## 2017-08-10 ENCOUNTER — Ambulatory Visit: Payer: 59 | Attending: Internal Medicine | Admitting: Cardiology

## 2017-08-10 DIAGNOSIS — I493 Ventricular premature depolarization: Secondary | ICD-10-CM | POA: Diagnosis not present

## 2017-08-10 DIAGNOSIS — R0683 Snoring: Secondary | ICD-10-CM | POA: Diagnosis present

## 2017-08-16 ENCOUNTER — Encounter (HOSPITAL_BASED_OUTPATIENT_CLINIC_OR_DEPARTMENT_OTHER): Payer: 59

## 2017-08-24 ENCOUNTER — Ambulatory Visit: Payer: 59 | Admitting: Internal Medicine

## 2017-08-24 ENCOUNTER — Encounter: Payer: Self-pay | Admitting: Internal Medicine

## 2017-08-24 VITALS — BP 122/82 | HR 60 | Ht 70.0 in | Wt 218.0 lb

## 2017-08-24 DIAGNOSIS — R0600 Dyspnea, unspecified: Secondary | ICD-10-CM

## 2017-08-24 DIAGNOSIS — I480 Paroxysmal atrial fibrillation: Secondary | ICD-10-CM

## 2017-08-24 DIAGNOSIS — R002 Palpitations: Secondary | ICD-10-CM

## 2017-08-24 NOTE — Progress Notes (Signed)
PCP: Rory Percy, MD Primary Cardiologist: Dr Domenic Polite Primary EP: Dr Rosilyn Mings [name] is a 62 y.o. male who presents today for routine electrophysiology followup.  Since last being seen in our clinic, the patient reports doing very well.  He is very pleased that his afib has dramatically improved.  Only one 90 minute episode since I saw him last.  Today, he denies symptoms of palpitations, chest pain, shortness of breath,  lower extremity edema, dizziness, presyncope, or syncope.  The patient is otherwise without complaint today.   Past Medical History:  Diagnosis Date  . Asthma   . Cancer (HCC)    Skin  . COPD (chronic obstructive pulmonary disease) ([place])   . Coronary atherosclerosis of native coronary artery    a. s/p DES to RCA in 2006 b. DES to LCx in 2012 with CTO of RCA noted at that time  . Hypertension   . Mixed hyperlipidemia   . Myocardial infarction ([place])    NSTEMI 4/12  . Paroxysmal atrial fibrillation (HCC)   . Seasonal allergies    Past Surgical History:  Procedure Laterality Date  . CARDIAC CATHETERIZATION N/A 07/26/2014   Procedure: Left Heart Cath and Coronary Angiography;  Surgeon: Peter M Martinique, MD;  Location: [place] [facility];  Service: Cardiovascular;  Laterality: N/A;  . CYSTOSCOPY  01/31/2013  . Nasal reconstruction surgery      ROS- all systems are reviewed and negatives except as per HPI above  Current Outpatient Medications  Medication Sig Dispense Refill  . acetaminophen (TYLENOL) 500 MG tablet Take 1,000 mg by mouth every 4 (four) hours as needed for moderate pain.     [name] atorvastatin (LIPITOR) 80 MG tablet take 1 tablet by mouth once daily 30 tablet 11  . ATROVENT HFA 17 MCG/ACT inhaler Inhale 2 puffs into the lungs every 6 (six) hours as needed for wheezing.   0  . dronedarone (MULTAQ) 400 MG tablet Take 1 tablet (400 mg total) by mouth 2 (two) times daily with a meal. 60 tablet 11  . guaiFENesin (MUCINEX) 600 MG 12 hr tablet Take 600  mg by mouth 2 (two) times daily.     [name] loratadine (CLARITIN) 10 MG tablet Take 10 mg by mouth daily.      . metoprolol tartrate (LOPRESSOR) 25 MG tablet Take 1 tablet (25 mg total) by mouth 2 (two) times daily. 180 tablet 3  . Multiple Vitamin (MULTIVITAMIN) tablet Take 1 tablet by mouth daily.      . nitroGLYCERIN (NITROSTAT) 0.4 MG SL tablet place 1 tablet under the tongue every 5 minutes for UP TO 3 doses if needed for angina as directed by prescriber 25 tablet 3  . Omega-3 Fatty Acids (FISH OIL) 1000 MG CAPS Take 2,000 mg by mouth 2 (two) times daily.     [name] QVAR 80 MCG/ACT inhaler Inhale 2 puffs into the lungs 2 (two) times daily.   1  . rivaroxaban (XARELTO) 20 MG TABS tablet Take 1 tablet (20 mg total) by mouth daily with supper. 30 tablet 11  . SPIRIVA HANDIHALER 18 MCG inhalation capsule Place 18 mcg into inhaler and inhale daily.   1  . VENTOLIN HFA 108 (90 BASE) MCG/ACT inhaler Inhale 2 puffs into the lungs every 4 (four) hours as needed for wheezing or shortness of breath.   1   No current facility-administered medications for this visit.     Physical Exam: Vitals:   08/24/17 1059  BP: 122/82  Pulse: 60  Weight: 218 lb (98.9 kg)  Height: 5\' 10"  (1.778 m)    GEN- The patient is well appearing, alert and oriented x 3 today.   Head- normocephalic, atraumatic Eyes-  Sclera clear, conjunctiva pink Ears- hearing intact Oropharynx- clear Lungs- Clear to ausculation bilaterally, normal work of breathing Heart- Regular rate and rhythm, no murmurs, rubs or gallops, PMI not laterally displaced GI- soft, NT, ND, + BS Extremities- no clubbing, cyanosis, or edema  Wt Readings from Last 3 Encounters:  08/24/17 218 lb (98.9 kg)  07/14/17 217 lb (98.4 kg)  07/11/17 215 lb (97.5 kg)    EKG tracing ordered today is personally reviewed and shows sinus rhythm with PVCs, 60 bpm, PR 198 msec, QRS 86 msec, Qtc 426 msec  Assessment and Plan:  1. Paroxysmal atrial fibrillation Doing well  with multaq, check bmet, lfts today No changes today On xarelto for chads2vasc score of 2 Consider ablation if further AF  2. CAD No ischemic symptoms  3. Tobacco cessation advised He is not ready to quit  4. Snoring sleep study has not yet been read by Dr Radford Pax.  Her office will call with results.  Follow-up with Dr Domenic Polite as scheduled I will see again in [place] in 4 months  Thompson Grayer MD, [facility] 08/24/2017 11:20 AM

## 2017-08-24 NOTE — Procedures (Signed)
   Patient Name: [name], [name] Date:03/01/2017 08/10/2017 Gender: Male D.O.B: 1955-04-05 Age (years): 40 Referring Provider: Thompson Grayer Height (inches): 18 Interpreting Physician: Fransico Him MD, ABSM Weight (lbs): 217 RPSGT: Peak, Robert BMI: 31 MRN: [id number] Neck Size: 16.50  CLINICAL INFORMATION Sleep Study Type: NPSG  Indication for sleep study: Snoring  Epworth Sleepiness Score: 2  SLEEP STUDY TECHNIQUE As per the AASM Manual for the Scoring of Sleep and Associated Events v2.3 (April 2016) with a hypopnea requiring 4% desaturations.  The channels recorded and monitored were frontal, central and occipital EEG, electrooculogram (EOG), submentalis EMG (chin), nasal and oral airflow, thoracic and abdominal wall motion, anterior tibialis EMG, snore microphone, electrocardiogram, and pulse oximetry.  MEDICATIONS Medications self-administered by patient taken the night of the study : N/A  SLEEP ARCHITECTURE The study was initiated at 9:22:28 PM and ended at 4:18:34 AM.  Sleep onset time was 10.5 minutes and the sleep efficiency was 84.6%%. The total sleep time was 352 minutes.  Stage REM latency was 127.5 minutes.  The patient spent 18.8%% of the night in stage N1 sleep, 52.0%% in stage N2 sleep, 0.0%% in stage N3 and 29.26% in REM.  Alpha intrusion was absent.  Supine sleep was 14.06%.  RESPIRATORY PARAMETERS The overall apnea/hypopnea index (AHI) was 1.0 per hour. There were 1 total apneas, including 0 obstructive, 1 central and 0 mixed apneas. There were 5 hypopneas and 91 RERAs.  The AHI during Stage REM sleep was 2.3 per hour.  AHI while supine was 3.6 per hour.  The mean oxygen saturation was 91.5%. The minimum SpO2 during sleep was 86.0%.  moderate snoring was noted during this study.  CARDIAC DATA The 2 lead EKG demonstrated sinus rhythm. The mean heart rate was 61.8 beats per minute. Other EKG findings include: PVCs.  LEG MOVEMENT DATA The  total PLMS were 0 with a resulting PLMS index of 0.0. Associated arousal with leg movement index was 2.2 .  IMPRESSIONS - No significant obstructive sleep apnea occurred during this study (AHI = 1.0/h). - No significant central sleep apnea occurred during this study (CAI = 0.2/h). - Mild oxygen desaturation was noted during this study (Min O2 = 86.0%). - The patient snored with moderate snoring volume. - EKG findings include PVCs. - Clinically significant periodic limb movements did not occur during sleep. No significant associated arousals.  DIAGNOSIS - Normal study - PVCs  RECOMMENDATIONS - Avoid alcohol, sedatives and other CNS depressants that may worsen sleep apnea and disrupt normal sleep architecture. - Sleep hygiene should be reviewed to assess factors that may improve sleep quality. - Weight management and regular exercise should be initiated or continued if appropriate.  [Electronically signed] 08/24/2017 08:31 PM  Fransico Him MD, ABSM Diplomate, American Board of Sleep Medicine

## 2017-08-24 NOTE — Patient Instructions (Addendum)
Medication Instructions:  Your physician recommends that you continue on your current medications as directed. Please refer to the Current Medication list given to you today.   Labwork: TODAY: BMET, LFTS  Testing/Procedures: None ordered  Follow-Up: Your physician wants you to follow-up in: 4 months with Dr. Rayann Heman in [place]. You will receive a reminder letter in the mail two months in advance. If you don't receive a letter, please call our office to schedule the follow-up appointment.   Any Other Special Instructions Will Be Listed Below (If Applicable).     If you need a refill on your cardiac medications before your next appointment, please call your pharmacy.

## 2017-08-25 LAB — HEPATIC FUNCTION PANEL
ALBUMIN: 4.2 g/dL (ref 3.6–4.8)
ALT: 32 IU/L (ref 0–44)
AST: 18 IU/L (ref 0–40)
Alkaline Phosphatase: 90 IU/L (ref 39–117)
Bilirubin Total: 0.5 mg/dL (ref 0.0–1.2)
Bilirubin, Direct: 0.19 mg/dL (ref 0.00–0.40)
TOTAL PROTEIN: 6.6 g/dL (ref 6.0–8.5)

## 2017-08-25 LAB — BASIC METABOLIC PANEL
BUN/Creatinine Ratio: 14 (ref 10–24)
BUN: 12 mg/dL (ref 8–27)
CALCIUM: 9.2 mg/dL (ref 8.6–10.2)
CO2: 22 mmol/L (ref 20–29)
CREATININE: 0.83 mg/dL (ref 0.76–1.27)
Chloride: 106 mmol/L (ref 96–106)
GFR calc Af Amer: 108 mL/min/{1.73_m2} (ref >59)
GFR, EST NON AFRICAN AMERICAN: 94 mL/min/{1.73_m2} (ref >59)
Glucose: 114 mg/dL — ABNORMAL HIGH (ref 65–99)
POTASSIUM: 4.5 mmol/L (ref 3.5–5.2)
Sodium: 144 mmol/L (ref 134–144)

## 2017-08-29 ENCOUNTER — Telehealth: Payer: Self-pay | Admitting: *Deleted

## 2017-08-29 NOTE — Telephone Encounter (Signed)
Informed patient of sleep study results and patient understanding was verbalized. Patient understands his sleep study showed no significant sleep apnea.   Pt is aware and agreeable to normal results. 

## 2017-08-29 NOTE — Telephone Encounter (Signed)
-----   Message from Sueanne Margarita, MD sent at 08/24/2017  8:35 PM EDT ----- Please let patient know that sleep study showed no significant sleep apnea.

## 2017-09-22 NOTE — Progress Notes (Signed)
Cardiology Office Note  Date: 09/23/2017   ID: Edward, Patton May 02, 1955, MRN [id number]  PCP: Edward Percy, MD  Primary Cardiologist: Edward Lesches, MD   Chief Complaint  Patient presents with  . PAF  . Coronary Artery Disease    History of Present Illness: [name] is a 62 y.o. male last seen in April.  Interval visits noted with Dr. Rayann Patton.  He has been managed with Multaq along with Xarelto for treatment of paroxysmal atrial fibrillation.  He is here today with his wife for a routine visit.  States that he feels much better in terms of palpitations, has had no angina symptoms.  I reviewed his recent ECG from June, and outlined below.  He reports no bleeding problems on Xarelto.  Additional cardiac regimen includes Lipitor, Multaq, Lopressor, and as needed nitroglycerin which he has not required recently.  Past Medical History:  Diagnosis Date  . Asthma   . Cancer (HCC)    Skin  . COPD (chronic obstructive pulmonary disease) ([place])   . Coronary atherosclerosis of native coronary artery    a. s/p DES to RCA in 2006 b. DES to LCx in 2012 with CTO of RCA noted at that time  . Hypertension   . Mixed hyperlipidemia   . Myocardial infarction ([name])    NSTEMI 4/12  . Paroxysmal atrial fibrillation (HCC)   . Seasonal allergies     Past Surgical History:  Procedure Laterality Date  . CARDIAC CATHETERIZATION N/A 07/26/2014   Procedure: Left Heart Cath and Coronary Angiography;  Surgeon: Edward M Martinique, MD;  Location: [place] [facility];  Service: Cardiovascular;  Laterality: N/A;  . CYSTOSCOPY  01/31/2013  . Nasal reconstruction surgery      Current Outpatient Medications  Medication Sig Dispense Refill  . acetaminophen (TYLENOL) 500 MG tablet Take 1,000 mg by mouth every 4 (four) hours as needed for moderate pain.     [name] atorvastatin (LIPITOR) 80 MG tablet take 1 tablet by mouth once daily 30 tablet 11  . ATROVENT HFA 17 MCG/ACT inhaler Inhale 2 puffs into  the lungs every 6 (six) hours as needed for wheezing.   0  . cephALEXin (KEFLEX) 500 MG capsule Take 500 mg by mouth 3 (three) times daily.    [name] dronedarone (MULTAQ) 400 MG tablet Take 1 tablet (400 mg total) by mouth 2 (two) times daily with a meal. 60 tablet 11  . guaiFENesin (MUCINEX) 600 MG 12 hr tablet Take 600 mg by mouth 2 (two) times daily.     [name] loratadine (CLARITIN) 10 MG tablet Take 10 mg by mouth daily.      . metoprolol tartrate (LOPRESSOR) 25 MG tablet Take 1 tablet (25 mg total) by mouth 2 (two) times daily. 180 tablet 3  . Multiple Vitamin (MULTIVITAMIN) tablet Take 1 tablet by mouth daily.      . nitroGLYCERIN (NITROSTAT) 0.4 MG SL tablet place 1 tablet under the tongue every 5 minutes for UP TO 3 doses if needed for angina as directed by prescriber 25 tablet 3  . Omega-3 Fatty Acids (FISH OIL) 1000 MG CAPS Take 2,000 mg by mouth 2 (two) times daily.     [name] QVAR 80 MCG/ACT inhaler Inhale 2 puffs into the lungs 2 (two) times daily.   1  . rivaroxaban (XARELTO) 20 MG TABS tablet Take 1 tablet (20 mg total) by mouth daily with supper. 30 tablet 11  . SPIRIVA HANDIHALER 18 MCG inhalation capsule Place  18 mcg into inhaler and inhale daily.   1  . VENTOLIN HFA 108 (90 BASE) MCG/ACT inhaler Inhale 2 puffs into the lungs every 4 (four) hours as needed for wheezing or shortness of breath.   1   No current facility-administered medications for this visit.    Allergies:  Patient has no known allergies.   Social History: The patient  reports that he has been smoking cigarettes.  He started smoking about 47 years ago. He has a 30.00 pack-year smoking history. He has never used smokeless tobacco. He reports that he does not drink alcohol or use drugs.   ROS:  Please see the history of present illness. Otherwise, complete review of systems is positive for none.  All other systems are reviewed and negative.   Physical Exam: VS:  BP 100/70   Pulse 64   Ht 5\' 10"  (1.778 m)   Wt 224 lb (101.6  kg)   SpO2 94%   BMI 32.14 kg/m , BMI Body mass index is 32.14 kg/m.  Wt Readings from Last 3 Encounters:  09/23/17 224 lb (101.6 kg)  08/24/17 218 lb (98.9 kg)  07/14/17 217 lb (98.4 kg)    General: Patient appears comfortable at rest. HEENT: Conjunctiva and lids normal, oropharynx clear. Neck: Supple, no elevated JVP or carotid bruits, no thyromegaly. Lungs: Clear to auscultation, nonlabored breathing at rest. Cardiac: Regular rate and rhythm, no S3 or significant systolic murmur, no pericardial rub. Abdomen: Soft, nontender, bowel sounds present. Extremities: No pitting edema, distal pulses 2+. Skin: Warm and dry. Musculoskeletal: No kyphosis. Neuropsychiatric: Alert and oriented x3, affect grossly appropriate.  ECG: I personally reviewed the tracing from 08/24/2017 which showed sinus rhythm with PVC, normal QT interval.  Recent Labwork: 06/01/2017: TSH 1.401 07/11/2017: B Natriuretic Peptide 187.0; Hemoglobin 16.7; Magnesium 1.8; Platelets 317 08/24/2017: ALT 32; AST 18; BUN 12; Creatinine, Ser 0.83; Potassium 4.5; Sodium 144   Other Studies Reviewed Today:  Echocardiogram 07/06/2017: Study Conclusions  - Left ventricle: The cavity size was mildly dilated. Wall   thickness was increased in a pattern of mild LVH. Systolic   function was normal. The estimated ejection fraction was in the   range of 60% to 65%. Possible hypokinesis of the basalinferior   myocardium. Left ventricular diastolic function parameters were   normal for the patient&'s age. - Aortic valve: Mildly calcified annulus. Trileaflet. There was   mild regurgitation. - Mitral valve: There was trivial regurgitation. - Right atrium: Central venous pressure (est): 3 mm Hg. - Atrial septum: No defect or patent foramen ovale was identified. - Tricuspid valve: There was trivial regurgitation. - Pulmonary arteries: Systolic pressure could not be accurately   estimated. - Pericardium, extracardiac: There was no  pericardial effusion.  Assessment and Plan:  1.  Paroxysmal atrial fibrillation.  He is doing much better in terms of symptom control on Multaq and otherwise continues on Lopressor as well as Xarelto.  Keep follow-up with Dr. Rayann Patton as scheduled.  2.  CAD status post DES to the circumflex with occluded mid RCA.  He reports no angina symptoms on current medical therapy.  Current medicines were reviewed with the patient today.   Disposition: Follow-up in 4 months.  Signed, Satira Sark, MD, [facility] 09/23/2017 2:35 PM    [facility] [place], [place], [place] [address] Phone: [phone]; Fax: [phone]

## 2017-09-23 ENCOUNTER — Encounter: Payer: Self-pay | Admitting: Cardiology

## 2017-09-23 ENCOUNTER — Ambulatory Visit: Payer: 59 | Admitting: Cardiology

## 2017-09-23 VITALS — BP 100/70 | HR 64 | Ht 70.0 in | Wt 224.0 lb

## 2017-09-23 DIAGNOSIS — I480 Paroxysmal atrial fibrillation: Secondary | ICD-10-CM

## 2017-09-23 DIAGNOSIS — I25119 Atherosclerotic heart disease of native coronary artery with unspecified angina pectoris: Secondary | ICD-10-CM

## 2017-09-23 NOTE — Patient Instructions (Signed)
Medication Instructions:  Your physician recommends that you continue on your current medications as directed. Please refer to the Current Medication list given to you today.  Labwork: NONE  Testing/Procedures: NONE  Follow-Up: Your physician wants you to follow-up in: 4 MONTHS WITH DR. MCDOWELL. You will receive a reminder letter in the mail two months in advance. If you don't receive a letter, please call our office to schedule the follow-up appointment.  Any Other Special Instructions Will Be Listed Below (If Applicable).  If you need a refill on your cardiac medications before your next appointment, please call your pharmacy. 

## 2017-10-05 ENCOUNTER — Telehealth: Payer: Self-pay | Admitting: Internal Medicine

## 2017-10-05 NOTE — Telephone Encounter (Signed)
[facility] pharmacy is requesting a medication change. Multaq is not covered by pt's insurance plan, the preferred alternative is Amiodarone. Please call [facility] in [place] at [phone]. Please address

## 2017-10-07 NOTE — Telephone Encounter (Signed)
I spoke with pharmacist and told her prior authorization had been done in May.  Pharmacist checked and Multaq is ready at the pharmacy with no copay.  They will notify pt medicine is ready to be picked up.

## 2017-11-21 ENCOUNTER — Other Ambulatory Visit: Payer: Self-pay | Admitting: *Deleted

## 2017-11-21 MED ORDER — ATORVASTATIN CALCIUM 80 MG PO TABS: 80.0000 mg | ORAL_TABLET | Freq: Every day | ORAL | 3 refills | Status: DC

## 2017-12-16 ENCOUNTER — Ambulatory Visit: Payer: 59 | Admitting: Internal Medicine

## 2018-01-13 ENCOUNTER — Telehealth: Payer: Self-pay | Admitting: Cardiology

## 2018-01-13 MED ORDER — RIVAROXABAN 20 MG PO TABS: 20.0000 mg | ORAL_TABLET | Freq: Every day | ORAL | 0 refills | Status: DC

## 2018-01-13 NOTE — Telephone Encounter (Signed)
Patient calling the office for samples of medication:   1.  What medication and dosage are you requesting samples for?   rivaroxaban (XARELTO) 20 MG TABS    2.  Are you currently out of this medication? Yes  Patient has lost his insurance until Jan 2020. They cannot afford to pay this.

## 2018-01-13 NOTE — Telephone Encounter (Signed)
Patient's wife contacted and advised that xarelto samples are available for pick up.

## 2018-01-25 ENCOUNTER — Ambulatory Visit: Payer: Self-pay | Admitting: Cardiology

## 2018-02-20 ENCOUNTER — Telehealth: Payer: Self-pay

## 2018-02-20 NOTE — Telephone Encounter (Addendum)
We received a PA request for the pts Multaq.  I called [employer] at [phone] as instructed in the [facility] PA request fax that we received and s/w Caren Griffins. Per Caren Griffins this PA request was run under a Xarelto co-pay card and that I should call the pts pharmacy and ask them to run the RX without using the co-pay card.  I called [facility] pharmacy and was advised that this PA should not have been sent to [place] as the pt has no insurance at this time but plans to have it by the beginning of 2020. Per [facility] a 30 day supply of Multaq will cost the pt $583.00. I was also advised that the pt stated that he would try to purchase enough Multaq to last until he does have insurance coverage but he did not purchase any today.  I will forward this message to Dr Rayann Heman and his nurse as Juluis Rainier to make them aware that the pt has no insurance at this time.

## 2018-02-21 ENCOUNTER — Telehealth: Payer: Self-pay | Admitting: Internal Medicine

## 2018-02-21 NOTE — Telephone Encounter (Signed)
Patient calling the office for samples of medication:   1.  What medication and dosage are you requesting samples for?   dronedarone (MULTAQ) 400 MG   2.  Are you currently out of this medication? Yes  Patient has had a lapse in insurance . They cannot afford.

## 2018-02-21 NOTE — Telephone Encounter (Signed)
Pt called back and says she was redirected back to our office from [facility] office - we no longer have a Multaq rep that bring this medication to [place] co - pt given [employer] pt assistance phone # and also applied for a free 30 day saving card

## 2018-02-21 NOTE — Telephone Encounter (Signed)
Pt aware [place] or [place] did not have samples of Multaq - pt will contact [address] office to see if they had any available

## 2018-02-22 ENCOUNTER — Telehealth: Payer: Self-pay | Admitting: Internal Medicine

## 2018-02-22 NOTE — Telephone Encounter (Signed)
Patient wife called stating that she was told by Drug company her husband needs to sign something?   She would like for soemone to call her in reference to this

## 2018-02-22 NOTE — Telephone Encounter (Signed)
Spoke with wife Novella Olive) - stated she was told by company that the doctor & he needed to sign something.  Explained to her that office has not seen any paper work on him come via fax today.  She will cal them back to request they fax [place] whatever information they need.  Also, will forward message to [facility] as she worked on this yesterday for patient.

## 2018-02-22 NOTE — Telephone Encounter (Signed)
Wife calling back reference she has not heard back from anyone.   I explained that our nurses were in clinic and they would call at their earliest convenience.

## 2018-02-22 NOTE — Telephone Encounter (Signed)
Wife called back and said they told her that they are faxing paper to [place] for him to come by and sign

## 2018-02-22 NOTE — Telephone Encounter (Signed)
Left message to return call 

## 2018-02-22 NOTE — Telephone Encounter (Addendum)
Wife advised that paperwork for multaq patient assistance is here and they can come by and fill out their portion and the office will complete the provider portion then fax to [employer]. Verbalized understanding of plan.

## 2018-02-28 ENCOUNTER — Telehealth: Payer: Self-pay | Admitting: *Deleted

## 2018-02-28 NOTE — Telephone Encounter (Signed)
Received fax from [employer] patient connection program - patient eligible to receive Multaq until 02/28/2019.

## 2018-03-06 ENCOUNTER — Telehealth: Payer: Self-pay | Admitting: *Deleted

## 2018-03-06 NOTE — Telephone Encounter (Signed)
Wife informed that multaq shipment came in today #180 tablets.

## 2018-03-23 ENCOUNTER — Encounter: Payer: Self-pay | Admitting: *Deleted

## 2018-03-24 ENCOUNTER — Ambulatory Visit: Payer: BLUE CROSS/BLUE SHIELD | Admitting: Internal Medicine

## 2018-03-24 ENCOUNTER — Encounter: Payer: Self-pay | Admitting: Internal Medicine

## 2018-03-24 VITALS — BP 124/78 | HR 65 | Ht 70.0 in | Wt 221.0 lb

## 2018-03-24 DIAGNOSIS — I25119 Atherosclerotic heart disease of native coronary artery with unspecified angina pectoris: Secondary | ICD-10-CM

## 2018-03-24 DIAGNOSIS — Z720 Tobacco use: Secondary | ICD-10-CM

## 2018-03-24 DIAGNOSIS — I480 Paroxysmal atrial fibrillation: Secondary | ICD-10-CM | POA: Diagnosis not present

## 2018-03-24 NOTE — Progress Notes (Signed)
PCP: Rory Percy, MD Primary Cardiologist: Dr Domenic Polite Primary EP: Dr Rosilyn Mings [name] is a 63 y.o. male who presents today for routine electrophysiology followup.  Since last being seen in our clinic, the patient reports doing very well.  Today, he denies symptoms of palpitations, chest pain, shortness of breath,  lower extremity edema, dizziness, presyncope, or syncope.  The patient is otherwise without complaint today.   Past Medical History:  Diagnosis Date  . Asthma   . Cancer (HCC)    Skin  . COPD (chronic obstructive pulmonary disease) ([place])   . Coronary atherosclerosis of native coronary artery    a. s/p DES to RCA in 2006 b. DES to LCx in 2012 with CTO of RCA noted at that time  . Hypertension   . Mixed hyperlipidemia   . Myocardial infarction ([place])    NSTEMI 4/12  . Paroxysmal atrial fibrillation (HCC)   . Seasonal allergies    Past Surgical History:  Procedure Laterality Date  . CARDIAC CATHETERIZATION N/A 07/26/2014   Procedure: Left Heart Cath and Coronary Angiography;  Surgeon: Peter M Martinique, MD;  Location: [place] [facility];  Service: Cardiovascular;  Laterality: N/A;  . CYSTOSCOPY  01/31/2013  . Nasal reconstruction surgery      ROS- all systems are reviewed and negatives except as per HPI above  Current Outpatient Medications  Medication Sig Dispense Refill  . acetaminophen (TYLENOL) 500 MG tablet Take 1,000 mg by mouth every 4 (four) hours as needed for moderate pain.     [name] atorvastatin (LIPITOR) 80 MG tablet Take 1 tablet (80 mg total) by mouth daily. 90 tablet 3  . ATROVENT HFA 17 MCG/ACT inhaler Inhale 2 puffs into the lungs every 6 (six) hours as needed for wheezing.   0  . dronedarone (MULTAQ) 400 MG tablet Take 1 tablet (400 mg total) by mouth 2 (two) times daily with a meal. 60 tablet 11  . guaiFENesin (MUCINEX) 600 MG 12 hr tablet Take 600 mg by mouth 2 (two) times daily.     [name] loratadine (CLARITIN) 10 MG tablet Take 10 mg by mouth daily.       . metoprolol tartrate (LOPRESSOR) 25 MG tablet Take 1 tablet (25 mg total) by mouth 2 (two) times daily. 180 tablet 3  . Multiple Vitamin (MULTIVITAMIN) tablet Take 1 tablet by mouth daily.      . nitroGLYCERIN (NITROSTAT) 0.4 MG SL tablet place 1 tablet under the tongue every 5 minutes for UP TO 3 doses if needed for angina as directed by prescriber 25 tablet 3  . Omega-3 Fatty Acids (FISH OIL) 1000 MG CAPS Take 2,000 mg by mouth 2 (two) times daily.     [name] QVAR 80 MCG/ACT inhaler Inhale 2 puffs into the lungs 2 (two) times daily.   1  . rivaroxaban (XARELTO) 20 MG TABS tablet Take 1 tablet (20 mg total) by mouth daily with supper. 56 tablet 0  . SPIRIVA HANDIHALER 18 MCG inhalation capsule Place 18 mcg into inhaler and inhale daily.   1  . VENTOLIN HFA 108 (90 BASE) MCG/ACT inhaler Inhale 2 puffs into the lungs every 4 (four) hours as needed for wheezing or shortness of breath.   1   No current facility-administered medications for this visit.     Physical Exam: Vitals:   03/24/18 0839  BP: 124/78  Pulse: 65  SpO2: 98%  Weight: 221 lb (100.2 kg)  Height: 5\' 10"  (1.778 m)  GEN- The patient is well appearing, alert and oriented x 3 today.   Head- normocephalic, atraumatic Eyes-  Sclera clear, conjunctiva pink Ears- hearing intact Oropharynx- clear Lungs- Clear to ausculation bilaterally, normal work of breathing Heart- Regular rate and rhythm, no murmurs, rubs or gallops, PMI not laterally displaced GI- soft, NT, ND, + BS Extremities- no clubbing, cyanosis, or edema  Wt Readings from Last 3 Encounters:  03/24/18 221 lb (100.2 kg)  09/23/17 224 lb (101.6 kg)  08/24/17 218 lb (98.9 kg)    EKG tracing ordered today is personally reviewed and shows sinus rhythm 70 bpm, PR 172 msec, QRS 84 msec, Qtc 457 msec, PVCs, inferior infarct  Assessment and Plan:  1. Paroxysmal atrail fibrillation Doing very well with multaq On xarelto  2. CAD No ischemic symptoms  3.  Snoring Does not have sleep apnea by prior sleep study  4. Tobacco Cessation advised  Return to see Dr Domenic Polite in 6 months I will see in a year  Thompson Grayer MD, [facility] 03/24/2018 9:24 AM

## 2018-03-24 NOTE — Patient Instructions (Signed)
Medication Instructions:  Continue all current medications.  Labwork: none  Testing/Procedures: none  Follow-Up:  Your physician wants you to follow up in: 6 months.  You will receive a reminder letter in the mail one-two months in advance.  If you don't receive a letter, please call our office to schedule the follow up appointment.  (Dr. Domenic Polite)  1 year - Dr. Rayann Heman  Any Other Special Instructions Will Be Listed Below (If Applicable).  If you need a refill on your cardiac medications before your next appointment, please call your pharmacy.

## 2018-06-06 ENCOUNTER — Other Ambulatory Visit: Payer: Self-pay | Admitting: Student

## 2018-06-07 NOTE — Telephone Encounter (Signed)
This is [facility] pt.

## 2018-06-29 ENCOUNTER — Telehealth: Payer: Self-pay

## 2018-06-29 NOTE — Telephone Encounter (Signed)
**Note De-Identified Mattison Golay Obfuscation** Multaq PA fax request from covermymeds received at the [facility] office. Will forward to the [place] office.   XTA:[id number] Bilotta DOB: Jun 27, 1955  Multaq 400mg 

## 2018-06-30 NOTE — Telephone Encounter (Signed)
Spoke with wife Novella Olive) - stated they last picked up prescription on 06/08/2018 for 30 day supply & used a 0$ co-pay card.    Call placed to [facility] - confirmed pick up date & that he does have no $0 co-pay card on file.  She did trying running through - only pops up that it's too early to fill, no mention of prior authorization in system.    Wife made aware of above - at next fill - if needs PA [facility] will fax message to office.  She verbalized understanding.

## 2018-06-30 NOTE — Telephone Encounter (Signed)
I tried to do this through Cover My Meds remotely, but was not able to.    Got message stating - There was an error with your request.  Cannot find matching patient with Name and Date of Birth provided. For additional information, please contact the phone number on the back of the member prescription ID card.

## 2018-07-18 DIAGNOSIS — Z0001 Encounter for general adult medical examination with abnormal findings: Secondary | ICD-10-CM | POA: Diagnosis not present

## 2018-07-19 DIAGNOSIS — F17210 Nicotine dependence, cigarettes, uncomplicated: Secondary | ICD-10-CM | POA: Diagnosis not present

## 2018-07-19 DIAGNOSIS — R739 Hyperglycemia, unspecified: Secondary | ICD-10-CM | POA: Diagnosis not present

## 2018-07-19 DIAGNOSIS — E7800 Pure hypercholesterolemia, unspecified: Secondary | ICD-10-CM | POA: Diagnosis not present

## 2018-07-19 DIAGNOSIS — I480 Paroxysmal atrial fibrillation: Secondary | ICD-10-CM | POA: Diagnosis not present

## 2018-08-08 ENCOUNTER — Other Ambulatory Visit: Payer: Self-pay | Admitting: Internal Medicine

## 2018-08-09 NOTE — Telephone Encounter (Signed)
Patient called stating that [facility] has not received refill on the Multag.

## 2018-08-13 ENCOUNTER — Other Ambulatory Visit: Payer: Self-pay | Admitting: Cardiology

## 2018-10-16 ENCOUNTER — Other Ambulatory Visit: Payer: Self-pay

## 2018-10-16 ENCOUNTER — Encounter: Payer: Self-pay | Admitting: Cardiology

## 2018-10-16 ENCOUNTER — Encounter: Payer: Self-pay | Admitting: *Deleted

## 2018-10-16 ENCOUNTER — Ambulatory Visit (INDEPENDENT_AMBULATORY_CARE_PROVIDER_SITE_OTHER): Payer: BC Managed Care – PPO | Admitting: Cardiology

## 2018-10-16 VITALS — BP 118/98 | HR 104 | Temp 98.7°F | Ht 70.0 in | Wt 241.0 lb

## 2018-10-16 DIAGNOSIS — I25119 Atherosclerotic heart disease of native coronary artery with unspecified angina pectoris: Secondary | ICD-10-CM | POA: Diagnosis not present

## 2018-10-16 DIAGNOSIS — I480 Paroxysmal atrial fibrillation: Secondary | ICD-10-CM

## 2018-10-16 DIAGNOSIS — I10 Essential (primary) hypertension: Secondary | ICD-10-CM

## 2018-10-16 DIAGNOSIS — E782 Mixed hyperlipidemia: Secondary | ICD-10-CM | POA: Diagnosis not present

## 2018-10-16 NOTE — Patient Instructions (Signed)

## 2018-10-16 NOTE — Progress Notes (Signed)
Cardiology Office Note  Date: 10/16/2018   ID: Patton, Edward 05-31-1955, MRN [id number]  PCP:  Edward Percy, MD  Cardiologist:  Edward Lesches, MD Electrophysiologist:  None   Chief Complaint  Patient presents with  . Atrial Fibrillation    History of Present Illness: [name] is a 63 y.o. male last seen in July 2019.  He is here for a routine visit.  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  He reports a lot less stress.  Feels better overall.  He does not report any sense of palpitations. He had a follow-up visit with Dr. Rayann Patton in January.  Heart rate was irregular today on examination, he did not feel any sense of palpitations.  He continues on Multaq and Lopressor as well as Xarelto for stroke prophylaxis.  He states that he had recent lab work with Dr. Nadara Patton.  He does not report any bleeding problems.  Past Medical History:  Diagnosis Date  . Asthma   . COPD (chronic obstructive pulmonary disease) ([place])   . Coronary atherosclerosis of native coronary artery    a. s/p DES to RCA in 2006 b. DES to LCx in 2012 with CTO of RCA noted at that time  . Hypertension   . Mixed hyperlipidemia   . Myocardial infarction ([place])    NSTEMI 4/12  . Paroxysmal atrial fibrillation (HCC)   . Seasonal allergies   . Skin cancer     Past Surgical History:  Procedure Laterality Date  . CARDIAC CATHETERIZATION N/A 07/26/2014   Procedure: Left Heart Cath and Coronary Angiography;  Surgeon: Edward M Martinique, MD;  Location: [facility];  Service: Cardiovascular;  Laterality: N/A;  . CYSTOSCOPY  01/31/2013  . Nasal reconstruction surgery      Current Outpatient Medications  Medication Sig Dispense Refill  . acetaminophen (TYLENOL) 500 MG tablet Take 1,000 mg by mouth every 4 (four) hours as needed for moderate pain.     [name] atorvastatin (LIPITOR) 80 MG tablet Take 1 tablet (80 mg total) by mouth daily. 90 tablet 3  .  ATROVENT HFA 17 MCG/ACT inhaler Inhale 2 puffs into the lungs every 6 (six) hours as needed for wheezing.   0  . guaiFENesin (MUCINEX) 600 MG 12 hr tablet Take 600 mg by mouth 2 (two) times daily.     [name] loratadine (CLARITIN) 10 MG tablet Take 10 mg by mouth daily.      . metoprolol tartrate (LOPRESSOR) 25 MG tablet SEE NOTES 270 tablet 0  . MULTAQ 400 MG tablet TAKE 1 TABLET BY MOUTH TWICE DAILY( EVERY TWELVE HOURS) WITH A MEAL 180 tablet 3  . Multiple Vitamin (MULTIVITAMIN) tablet Take 1 tablet by mouth daily.      . nitroGLYCERIN (NITROSTAT) 0.4 MG SL tablet place 1 tablet under the tongue every 5 minutes for UP TO 3 doses if needed for angina as directed by prescriber 25 tablet 3  . Omega-3 Fatty Acids (FISH OIL) 1000 MG CAPS Take 2,000 mg by mouth 2 (two) times daily.     [name] QVAR 80 MCG/ACT inhaler Inhale 2 puffs into the lungs 2 (two) times daily.   1  . SPIRIVA HANDIHALER 18 MCG inhalation capsule Place 18 mcg into inhaler and inhale daily.   1  . VENTOLIN HFA 108 (90 BASE) MCG/ACT inhaler Inhale 2 puffs into the lungs every 4 (four) hours as needed for wheezing or shortness of  breath.   1  . XARELTO 20 MG TABS tablet TAKE 1 TABLET BY MOUTH DAILY WITH SUPPER 30 tablet 6   No current facility-administered medications for this visit.    Allergies:  Patient has no known allergies.   Social History: The patient  reports that he has quit smoking. His smoking use included cigarettes. He started smoking about 48 years ago. He has a 30.00 pack-year smoking history. He has never used smokeless tobacco. He reports that he does not drink alcohol or use drugs.   ROS:  Please see the history of present illness. Otherwise, complete review of systems is positive for none.  All other systems are reviewed and negative.   Physical Exam: VS:  BP (!) 118/98   Pulse (!) 104   Temp 98.7 F (37.1 C)   Ht 5\' 10"  (1.778 m)   Wt 241 lb (109.3 kg)   SpO2 93%   BMI 34.58 kg/m , BMI Body mass index is 34.58  kg/m.  Wt Readings from Last 3 Encounters:  10/16/18 241 lb (109.3 kg)  03/24/18 221 lb (100.2 kg)  09/23/17 224 lb (101.6 kg)    General: Patient appears comfortable at rest. HEENT: Conjunctiva and lids normal, wearing a mask. Neck: Supple, no elevated JVP or carotid bruits, no thyromegaly. Lungs: Clear to auscultation, nonlabored breathing at rest. Cardiac: Irregularly irregular, no S3 or significant systolic murmur. Abdomen: Soft, nontender, bowel sounds present. Extremities: No pitting edema, distal pulses 2+. Skin: Warm and dry. Musculoskeletal: No kyphosis. Neuropsychiatric: Alert and oriented x3, affect grossly appropriate.  ECG:  An ECG dated 03/24/2018 was personally reviewed today and demonstrated:  Sinus rhythm with PVCs and old inferior infarct pattern.  Recent Labwork:  08/24/2017: ALT 32; AST 18; BUN 12; Creatinine, Ser 0.83; Potassium 4.5; Sodium 144   Other Studies Reviewed Today:  Echocardiogram 07/06/2017: Study Conclusions  - Left ventricle: The cavity size was mildly dilated. Wall thickness was increased in a pattern of mild LVH. Systolic function was normal. The estimated ejection fraction was in the range of 60% to 65%. Possible hypokinesis of the basalinferior myocardium. Left ventricular diastolic function parameters were normal for the patient&'s age. - Aortic valve: Mildly calcified annulus. Trileaflet. There was mild regurgitation. - Mitral valve: There was trivial regurgitation. - Right atrium: Central venous pressure (est): 3 mm Hg. - Atrial septum: No defect or patent foramen ovale was identified. - Tricuspid valve: There was trivial regurgitation. - Pulmonary arteries: Systolic pressure could not be accurately estimated. - Pericardium, extracardiac: There was no pericardial effusion.  Assessment and Plan:  1.  Paroxysmal to persistent atrial fibrillation.  Symptomatically speaking he feels better.  He is in atrial fibrillation  today on examination but did not feel any sense of palpitations.  For now we will continue Multaq and Lopressor along with Xarelto for stroke prophylaxis.  If his atrial fibrillation is becoming more frequent, we may adopt a strategy of permanent atrial fibrillation with heart rate control and anticoagulation presuming symptoms are not an issue.  Requesting lab work from Dr [name].  2.  Status post DES to the circumflex with occluded mid RCA.  He reports no active angina at this time.  He continues on statin therapy, not on aspirin given concurrent use of Xarelto.  3.  Essential hypertension, continue with current medications and follow-up with PCP.  4.  Mixed hyperlipidemia on Lipitor.  Medication Adjustments/Labs and Tests Ordered: Current medicines are reviewed at length with the patient today.  Concerns regarding  medicines are outlined above.   Tests Ordered: No orders of the defined types were placed in this encounter.   Medication Changes: No orders of the defined types were placed in this encounter.   Disposition:  Follow up 6 months in the [place] office.  Signed, Satira Sark, MD, [facility] 10/16/2018 1:57 PM    [facility] [place], [place], [place] [address] Phone: [phone]; Fax: [phone]

## 2018-11-26 ENCOUNTER — Other Ambulatory Visit: Payer: Self-pay | Admitting: Cardiology

## 2018-12-20 ENCOUNTER — Other Ambulatory Visit: Payer: Self-pay | Admitting: *Deleted

## 2018-12-20 MED ORDER — METOPROLOL TARTRATE 25 MG PO TABS: ORAL_TABLET | ORAL | 2 refills | Status: DC

## 2018-12-26 ENCOUNTER — Other Ambulatory Visit: Payer: Self-pay | Admitting: Cardiology

## 2019-01-10 ENCOUNTER — Other Ambulatory Visit: Payer: Self-pay

## 2019-01-10 MED ORDER — RIVAROXABAN 20 MG PO TABS: ORAL_TABLET | ORAL | 6 refills | Status: DC

## 2019-01-30 ENCOUNTER — Other Ambulatory Visit: Payer: Self-pay | Admitting: Cardiology

## 2019-03-23 ENCOUNTER — Telehealth (INDEPENDENT_AMBULATORY_CARE_PROVIDER_SITE_OTHER): Payer: Medicare Other | Admitting: Internal Medicine

## 2019-03-23 ENCOUNTER — Encounter: Payer: Self-pay | Admitting: Internal Medicine

## 2019-03-23 VITALS — BP 122/84 | HR 86 | Ht 70.0 in | Wt 245.0 lb

## 2019-03-23 DIAGNOSIS — Z720 Tobacco use: Secondary | ICD-10-CM | POA: Diagnosis not present

## 2019-03-23 DIAGNOSIS — I25119 Atherosclerotic heart disease of native coronary artery with unspecified angina pectoris: Secondary | ICD-10-CM | POA: Diagnosis not present

## 2019-03-23 DIAGNOSIS — I10 Essential (primary) hypertension: Secondary | ICD-10-CM

## 2019-03-23 DIAGNOSIS — I480 Paroxysmal atrial fibrillation: Secondary | ICD-10-CM

## 2019-03-23 NOTE — Progress Notes (Signed)
Electrophysiology TeleHealth Note  Due to national recommendations of social distancing due to [place] 19, an audio telehealth visit is felt to be most appropriate for this patient at this time.  Verbal consent was obtained by me for the telehealth visit today.  The patient does not have capability for a virtual visit.  A phone visit is therefore required today.   Date:  03/23/2019   ID:  [name], DOB [birth year], MRN [id number]  Location: patient's home  Provider location:  [facility]  Evaluation Performed: Follow-up visit  PCP:  Rory Percy, MD   Electrophysiologist:  Dr Rayann Heman  Chief Complaint:  palpitations  History of Present Illness:    [name] is a 64 y.o. male who presents via telehealth conferencing today.  Since last being seen in our clinic, the patient reports doing very well.  He is pleased with his current health state.  No afib this past year.  Staying active in his shop.  Today, he denies symptoms of palpitations, chest pain, shortness of breath,  lower extremity edema, dizziness, presyncope, or syncope.  The patient is otherwise without complaint today.  The patient denies symptoms of fevers, chills, cough, or new SOB worrisome for COVID 19.  Past Medical History:  Diagnosis Date  . Asthma   . COPD (chronic obstructive pulmonary disease) ([place])   . Coronary atherosclerosis of native coronary artery    a. s/p DES to RCA in 2006 b. DES to LCx in 2012 with CTO of RCA noted at that time  . Hypertension   . Mixed hyperlipidemia   . Myocardial infarction ([place])    NSTEMI 4/12  . Paroxysmal atrial fibrillation (HCC)   . Seasonal allergies   . Skin cancer     Past Surgical History:  Procedure Laterality Date  . CARDIAC CATHETERIZATION N/A 07/26/2014   Procedure: Left Heart Cath and Coronary Angiography;  Surgeon: Peter M Martinique, MD;  Location: [facility];  Service: Cardiovascular;  Laterality: N/A;  . CYSTOSCOPY  01/31/2013  . Nasal  reconstruction surgery      Current Outpatient Medications  Medication Sig Dispense Refill  . acetaminophen (TYLENOL) 500 MG tablet Take 1,000 mg by mouth every 4 (four) hours as needed for moderate pain.     [name] atorvastatin (LIPITOR) 80 MG tablet TAKE 1 TABLET(80 MG) BY MOUTH DAILY 30 tablet 6  . ATROVENT HFA 17 MCG/ACT inhaler Inhale 2 puffs into the lungs every 6 (six) hours as needed for wheezing.   0  . guaiFENesin (MUCINEX) 600 MG 12 hr tablet Take 600 mg by mouth 2 (two) times daily.     [name] loratadine (CLARITIN) 10 MG tablet Take 10 mg by mouth daily.      . metoprolol tartrate (LOPRESSOR) 25 MG tablet SEE NOTES 270 tablet 2  . MULTAQ 400 MG tablet TAKE 1 TABLET BY MOUTH TWICE DAILY( EVERY TWELVE HOURS) WITH A MEAL 180 tablet 3  . Multiple Vitamin (MULTIVITAMIN) tablet Take 1 tablet by mouth daily.      . nitroGLYCERIN (NITROSTAT) 0.4 MG SL tablet place 1 tablet under the tongue every 5 minutes for UP TO 3 doses if needed for angina as directed by prescriber 25 tablet 3  . Omega-3 Fatty Acids (FISH OIL) 1000 MG CAPS Take 2,000 mg by mouth 2 (two) times daily.     [name] QVAR 80 MCG/ACT inhaler Inhale 2 puffs into the lungs 2 (two) times daily.   1  . rivaroxaban (  XARELTO) 20 MG TABS tablet TAKE 1 TABLET BY MOUTH DAILY WITH SUPPER 30 tablet 6  . SPIRIVA HANDIHALER 18 MCG inhalation capsule Place 18 mcg into inhaler and inhale daily.   1  . VENTOLIN HFA 108 (90 BASE) MCG/ACT inhaler Inhale 2 puffs into the lungs every 4 (four) hours as needed for wheezing or shortness of breath.   1   No current facility-administered medications for this visit.    Allergies:   Patient has no known allergies.   Social History:  The patient  reports that he has quit smoking. His smoking use included cigarettes. He started smoking about 49 years ago. He has a 30.00 pack-year smoking history. He has never used smokeless tobacco. He reports that he does not drink alcohol or use drugs.   Family History:  The  patient's family history includes COPD in his father; Cancer in his mother; Coronary artery disease in an other family member; Diabetes in his mother; Heart disease in his father and mother; High blood pressure in his father.   ROS:  Please see the history of present illness.   All other systems are personally reviewed and negative.    Exam:    Vital Signs:  There were no vitals taken for this visit.  Well sounding, alert and conversant   Labs/Other Tests and Data Reviewed:    Recent Labs: No results found for requested labs within last 8760 hours.   Wt Readings from Last 3 Encounters:  10/16/18 241 lb (109.3 kg)  03/24/18 221 lb (100.2 kg)  09/23/17 224 lb (101.6 kg)     ASSESSMENT & PLAN:    1.  Paroxysmal atrial fibrillation Doing well with multaq without recent recurrence chads2vasc score is at least 3.  He is on xarelto for stroke prevention No changes today  2. CAD No ischemic symptoms  3. HTN Stable No change required today  4. Tobacco Cessation advised   Follow-up:  12 months with me in [place]   Patient Risk:  after full review of this patients clinical status, I feel that they are at moderate risk at this time.  Today, I have spent 15 minutes with the patient with telehealth technology discussing arrhythmia management .    Army Fossa, MD  03/23/2019 8:03 AM     [facility] [address] [place] [address] [phone] (office) [phone] (fax)

## 2019-04-16 ENCOUNTER — Telehealth (INDEPENDENT_AMBULATORY_CARE_PROVIDER_SITE_OTHER): Payer: Medicare Other | Admitting: Cardiology

## 2019-04-16 ENCOUNTER — Encounter: Payer: Self-pay | Admitting: Cardiology

## 2019-04-16 VITALS — BP 127/86 | HR 69 | Ht 70.0 in | Wt 228.0 lb

## 2019-04-16 DIAGNOSIS — I25119 Atherosclerotic heart disease of native coronary artery with unspecified angina pectoris: Secondary | ICD-10-CM | POA: Diagnosis not present

## 2019-04-16 DIAGNOSIS — E782 Mixed hyperlipidemia: Secondary | ICD-10-CM | POA: Diagnosis not present

## 2019-04-16 DIAGNOSIS — I4819 Other persistent atrial fibrillation: Secondary | ICD-10-CM | POA: Diagnosis not present

## 2019-04-16 DIAGNOSIS — I10 Essential (primary) hypertension: Secondary | ICD-10-CM

## 2019-04-16 NOTE — Patient Instructions (Signed)

## 2019-04-16 NOTE — Progress Notes (Signed)
Virtual Visit via Telephone Note   This visit type was conducted due to national recommendations for restrictions regarding the COVID-19 Pandemic (e.g. social distancing) in an effort to limit this patient's exposure and mitigate transmission in our community.  Due to his co-morbid illnesses, this patient is at least at moderate risk for complications without adequate follow up.  This format is felt to be most appropriate for this patient at this time.  The patient did not have access to video technology/had technical difficulties with video requiring transitioning to audio format only (telephone).  All issues noted in this document were discussed and addressed.  No physical exam could be performed with this format.  Please refer to the patient's chart for his  consent to telehealth for [facility].   Date:  04/16/2019   ID:  [name], DOB October 19, 1955, MRN [id number]  Patient Location: Home Provider Location: Office  PCP:  Rory Percy, MD  Cardiologist:  Rozann Lesches, MD Electrophysiologist:  None   Evaluation Performed:  Follow-Up Visit  Chief Complaint:  Cardiac follow-up  History of Present Illness:    [name] is a 64 y.o. male last seen in August 2020.  We spoke by phone today.  He states that he has been doing reasonably well, stays around the house most of the time during the pandemic.  He and his wife both plan to get the vaccine when available.  He does not report any palpitations or chest pain.  He continues to follow with Dr. Rayann Heman, had a telehealth encounter recently in January.  He remains on Xarelto, Lopressor, and Multaq.  He plans to have follow-up lab work with his PCP in March.  He does not report any bleeding problems.  The patient does not have symptoms concerning for COVID-19 infection (fever, chills, cough, or new shortness of breath).    Past Medical History:  Diagnosis Date  . Asthma   . COPD (chronic obstructive pulmonary disease)  ([place])   . Coronary atherosclerosis of native coronary artery    a. s/p DES to RCA in 2006 b. DES to LCx in 2012 with CTO of RCA noted at that time  . Hypertension   . Mixed hyperlipidemia   . Myocardial infarction ([place])    NSTEMI 4/12  . Paroxysmal atrial fibrillation (HCC)   . Seasonal allergies   . Skin cancer    Past Surgical History:  Procedure Laterality Date  . CARDIAC CATHETERIZATION N/A 07/26/2014   Procedure: Left Heart Cath and Coronary Angiography;  Surgeon: Peter M Martinique, MD;  Location: [facility];  Service: Cardiovascular;  Laterality: N/A;  . CYSTOSCOPY  01/31/2013  . Nasal reconstruction surgery       Current Meds  Medication Sig  . acetaminophen (TYLENOL) 500 MG tablet Take 1,000 mg by mouth every 4 (four) hours as needed for moderate pain.   [name] albuterol (PROVENTIL) (2.5 MG/3ML) 0.083% nebulizer solution Take 2.5 mg by nebulization as needed for wheezing or shortness of breath.  [name] atorvastatin (LIPITOR) 80 MG tablet TAKE 1 TABLET(80 MG) BY MOUTH DAILY  . ATROVENT HFA 17 MCG/ACT inhaler Inhale 2 puffs into the lungs every 6 (six) hours as needed for wheezing.   [name] guaiFENesin (MUCINEX) 600 MG 12 hr tablet Take 600 mg by mouth 2 (two) times daily.   [name] loratadine (CLARITIN) 10 MG tablet Take 10 mg by mouth daily.    . metoprolol tartrate (LOPRESSOR) 25 MG tablet Take 25 mg by mouth 2 (  two) times daily.  . MULTAQ 400 MG tablet TAKE 1 TABLET BY MOUTH TWICE DAILY( EVERY TWELVE HOURS) WITH A MEAL  . Multiple Vitamin (MULTIVITAMIN) tablet Take 1 tablet by mouth daily.    . nitroGLYCERIN (NITROSTAT) 0.4 MG SL tablet place 1 tablet under the tongue every 5 minutes for UP TO 3 doses if needed for angina as directed by prescriber  . Omega-3 Fatty Acids (FISH OIL) 1000 MG CAPS Take 2,000 mg by mouth 2 (two) times daily.   [name] QVAR 80 MCG/ACT inhaler Inhale 2 puffs into the lungs 2 (two) times daily.   . rivaroxaban (XARELTO) 20 MG TABS tablet TAKE 1 TABLET BY MOUTH DAILY WITH  SUPPER  . SPIRIVA HANDIHALER 18 MCG inhalation capsule Place 18 mcg into inhaler and inhale daily.   . VENTOLIN HFA 108 (90 BASE) MCG/ACT inhaler Inhale 2 puffs into the lungs every 4 (four) hours as needed for wheezing or shortness of breath.      Allergies:   Patient has no known allergies.   Social History   Tobacco Use  . Smoking status: Former Smoker    Packs/day: 1.00    Years: 30.00    Pack years: 30.00    Types: Cigarettes    Start date: 03/12/1970  . Smokeless tobacco: Never Used  Substance Use Topics  . Alcohol use: No    Alcohol/week: 0.0 standard drinks  . Drug use: No     Family Hx: The patient's family history includes COPD in his father; Cancer in his mother; Coronary artery disease in an other family member; Diabetes in his mother; Heart disease in his father and mother; High blood pressure in his father.  ROS:   Please see the history of present illness. All other systems reviewed and are negative.   Prior CV studies:   The following studies were reviewed today:  Echocardiogram 07/06/2017: Study Conclusions   - Left ventricle: The cavity size was mildly dilated. Wall  thickness was increased in a pattern of mild LVH. Systolic  function was normal. The estimated ejection fraction was in the  range of 60% to 65%. Possible hypokinesis of the basalinferior  myocardium. Left ventricular diastolic function parameters were  normal for the patient&'s age.  - Aortic valve: Mildly calcified annulus. Trileaflet. There was  mild regurgitation.  - Mitral valve: There was trivial regurgitation.  - Right atrium: Central venous pressure (est): 3 mm Hg.  - Atrial septum: No defect or patent foramen ovale was identified.  - Tricuspid valve: There was trivial regurgitation.  - Pulmonary arteries: Systolic pressure could not be accurately  estimated.  - Pericardium, extracardiac: There was no pericardial effusion.   Labs/Other Tests and Data Reviewed:      EKG:  An ECG dated 03/24/2018 was personally reviewed today and demonstrated:  Sinus rhythm with PVCs and old inferior infarct pattern.  Recent Labs:  May 2020: BUN 15, creatinine 1.1, potassium 4.5, AST 16, ALT 36, cholesterol 107, triglycerides 63, HDL 36, LDL 58, hemoglobin 18.4, platelets 352, TSH 2.35, hemoglobin A1c 5.6%  Wt Readings from Last 3 Encounters:  04/16/19 228 lb (103.4 kg)  03/23/19 245 lb (111.1 kg)  10/16/18 241 lb (109.3 kg)     Objective:    Vital Signs:  BP 127/86   Pulse 69   Ht 5\' 10"  (1.778 m)   Wt 228 lb (103.4 kg)   SpO2 95%   BMI 32.71 kg/m    Patient spoke in full sentences, not short of  breath. No audible wheezing or coughing. Speech pattern normal.  ASSESSMENT & PLAN:    1.  Paroxysmal to persistent atrial fibrillation.  He reports no active palpitations and continues on Multaq, Lopressor, and Xarelto.  Follow-up lab work with PCP in March as planned.  We will see him back in 6 months with ECG.  2.  CAD status post DES to the circumflex with known occlusion of the mid RCA.  He does not report any active angina with current ADLs.  Not on aspirin given use of Xarelto.  Continue metoprolol and Lipitor.  3.  Essential hypertension by history.  Systolic blood pressures in the 120s today.  No changes to current regimen.  4.  Mixed hyperlipidemia, continues on Lipitor.  Last LDL 58.  COVID-19 Education: The signs and symptoms of COVID-19 were discussed with the patient and how to seek care for testing (follow up with PCP or arrange E-visit).  The importance of social distancing was discussed today.  Time:   Today, I have spent 7 minutes with the patient with telehealth technology discussing the above problems.     Medication Adjustments/Labs and Tests Ordered: Current medicines are reviewed at length with the patient today.  Concerns regarding medicines are outlined above.   Tests Ordered: No orders of the defined types were placed in this  encounter.   Medication Changes: No orders of the defined types were placed in this encounter.   Follow Up:  In Person 6 months in the [place] office.  Signed, Rozann Lesches, MD  04/16/2019 9:49 AM    [facility]

## 2019-05-02 ENCOUNTER — Other Ambulatory Visit: Payer: Self-pay | Admitting: *Deleted

## 2019-05-02 MED ORDER — RIVAROXABAN 20 MG PO TABS: ORAL_TABLET | ORAL | 3 refills | Status: DC

## 2019-05-15 DIAGNOSIS — M79641 Pain in right hand: Secondary | ICD-10-CM | POA: Diagnosis not present

## 2019-05-15 DIAGNOSIS — M109 Gout, unspecified: Secondary | ICD-10-CM | POA: Diagnosis not present

## 2019-05-28 DIAGNOSIS — M25511 Pain in right shoulder: Secondary | ICD-10-CM | POA: Diagnosis not present

## 2019-06-04 DIAGNOSIS — M25511 Pain in right shoulder: Secondary | ICD-10-CM | POA: Diagnosis not present

## 2019-06-18 DIAGNOSIS — M25511 Pain in right shoulder: Secondary | ICD-10-CM | POA: Diagnosis not present

## 2019-06-18 DIAGNOSIS — M7591 Shoulder lesion, unspecified, right shoulder: Secondary | ICD-10-CM | POA: Diagnosis not present

## 2019-06-22 DIAGNOSIS — M67911 Unspecified disorder of synovium and tendon, right shoulder: Secondary | ICD-10-CM | POA: Diagnosis not present

## 2019-06-22 DIAGNOSIS — M25511 Pain in right shoulder: Secondary | ICD-10-CM | POA: Diagnosis not present

## 2019-06-27 DIAGNOSIS — M7541 Impingement syndrome of right shoulder: Secondary | ICD-10-CM | POA: Diagnosis not present

## 2019-06-28 DIAGNOSIS — M7541 Impingement syndrome of right shoulder: Secondary | ICD-10-CM | POA: Diagnosis not present

## 2019-07-27 DIAGNOSIS — M7541 Impingement syndrome of right shoulder: Secondary | ICD-10-CM | POA: Diagnosis not present

## 2019-07-30 ENCOUNTER — Other Ambulatory Visit: Payer: Self-pay | Admitting: Cardiology

## 2019-07-30 ENCOUNTER — Other Ambulatory Visit: Payer: Self-pay | Admitting: Internal Medicine

## 2019-08-03 DIAGNOSIS — J441 Chronic obstructive pulmonary disease with (acute) exacerbation: Secondary | ICD-10-CM | POA: Diagnosis not present

## 2019-08-03 DIAGNOSIS — E7800 Pure hypercholesterolemia, unspecified: Secondary | ICD-10-CM | POA: Diagnosis not present

## 2019-08-03 DIAGNOSIS — I480 Paroxysmal atrial fibrillation: Secondary | ICD-10-CM | POA: Diagnosis not present

## 2019-08-03 DIAGNOSIS — R739 Hyperglycemia, unspecified: Secondary | ICD-10-CM | POA: Diagnosis not present

## 2019-08-08 DIAGNOSIS — Z0001 Encounter for general adult medical examination with abnormal findings: Secondary | ICD-10-CM | POA: Diagnosis not present

## 2019-08-08 DIAGNOSIS — M7551 Bursitis of right shoulder: Secondary | ICD-10-CM | POA: Diagnosis not present

## 2019-08-08 DIAGNOSIS — I480 Paroxysmal atrial fibrillation: Secondary | ICD-10-CM | POA: Diagnosis not present

## 2019-08-08 DIAGNOSIS — R739 Hyperglycemia, unspecified: Secondary | ICD-10-CM | POA: Diagnosis not present

## 2019-08-08 DIAGNOSIS — J449 Chronic obstructive pulmonary disease, unspecified: Secondary | ICD-10-CM | POA: Diagnosis not present

## 2019-08-16 ENCOUNTER — Telehealth: Payer: Self-pay | Admitting: Cardiology

## 2019-08-16 NOTE — Telephone Encounter (Signed)
I spoke with Dr.Case's office.They are aware Dr.McDowell stated patient could stop Xarelto 4 days prior to surgery. The state however wife requested that they send request to cardiology office, so, the have faxed form to [facility] office.

## 2019-08-16 NOTE — Telephone Encounter (Signed)
[name] called stating that patient is scheduled for Orthopedic surgery with Dr. Case next week. Patient is concerned about coming off Xarelto. Explained to [name] that Dr. Case office needs to contact us ASAP in regards to surgery and his medications.

## 2019-08-17 ENCOUNTER — Telehealth: Payer: Self-pay

## 2019-08-17 NOTE — Telephone Encounter (Signed)
Left message for the patient to call back and speak to the on-call preop APP of the day 

## 2019-08-17 NOTE — Telephone Encounter (Addendum)
Patient with diagnosis of afib on Xarelto for anticoagulation.    Procedure:  RIGHT SHOULDER ARTHROSCOPY W/SUBACROMIAL DECOMPRESSION  Date of procedure: TBD  CHADS2-VASc score of  3 (HTN, AGE, CAD)  CrCl 91 ml/min  Per office protocol, patient can hold Xarelto for 3 days prior to procedure.

## 2019-08-17 NOTE — Telephone Encounter (Signed)
   [facility] Pre-operative Risk Assessment    HEARTCARE STAFF: - Please ensure there is not already an duplicate clearance open for this procedure. - Under Visit Info/Reason for Call, type in Other and utilize the format Clearance MM/DD/YY or Clearance TBD. Do not use dashes or single digits. - If request is for dental extraction, please clarify the # of teeth to be extracted.  Request for surgical clearance:  1. What type of surgery is being performed? RIGHT SHOULDER ARTHROSCOPY W/SUBACROMIAL DECOMPRESSION   2. When is this surgery scheduled? TBD   3. What type of clearance is required (medical clearance vs. Pharmacy clearance to hold med vs. Both)? BOTH  4. Are there any medications that need to be held prior to surgery and how long? XARELTO X 4 DAYS PRIOR   5. Practice name and name of physician performing surgery? [facility] [place]    6. What is the office phone number? [phone]   7.   What is the office fax number? [phone]  8.   Anesthesia type (None, local, MAC, general) ? NONE LISTED    Jacinta Shoe 08/17/2019, 1:54 PM  _________________________________________________________________ **PLEASE ENSURE THE PATIENT HAS A A1C THAT IS LESS THAN 7.0 TO BE MEDICALLY CLEARED FOR THE SURGICAL PROCEDURE.

## 2019-08-21 NOTE — Telephone Encounter (Signed)
Called and made patient aware that he has been made aware that we received/reviewed his EKG and he is cleared for his procedure and can hold his Xarelto for 3 days prior to procedure. He verbalized understanding and thanked me for the call.  EKG has been given to Medical Records to scan to the patient's chart.

## 2019-08-21 NOTE — Telephone Encounter (Signed)
   Primary Cardiologist: Rozann Lesches, MD  Chart reviewed as part of pre-operative protocol coverage. Patient was last seen by Dr. Domenic Polite for a virtual visit on 04/16/2019 at which time he was doing well from a cardiac standpoint. Patient contacted on 08/21/2019 for pre-op risk assessment. He denied any changes since last visit. He has chronic shortness of breath with exertion due to COPD but states that this is stable. No chest pain, shortness of breath at rest, orthopnea, PND, edema. He states he thinks he has had a couple of brief episodes of atrial fibrillation. Asymptomatic with these episodes but noted elevated rates when checking vitals. No palpitations, lightheadedness, dizziness, or syncope. Able to complete > 4.0 METS of activity.   No updated EKG in our system since 03/2018. Patient states he recently had EKG at PCP's office. Pre-op covering staff, can we see if we can have PCP fax a copy of this to our office for pre-op APP to review. If no concerning changes, patient will be OK for surgery.    Darreld Mclean, PA-C 08/21/2019, 2:44 PM

## 2019-08-21 NOTE — Telephone Encounter (Signed)
Called and spoke to Amy at patient's PCP and asked her to send patient's most recent EKG to our office. She states that right now her fax machine is down, but as soon as it is fixed she will fax his EKG over.

## 2019-08-21 NOTE — Telephone Encounter (Signed)
   Primary Cardiologist: Rozann Lesches, MD  Chart reviewed as part of pre-operative protocol coverage. Patient was last seen by Dr. Domenic Polite for a virtual visit on 04/16/2019 at which time he was doing well from a cardiac standpoint. Patient contacted on 08/21/2019 for pre-op risk assessment. He denied any changes since last visit. He has chronic shortness of breath with exertion due to COPD but states that this is stable. No chest pain, shortness of breath at rest, orthopnea, PND, edema. He states he thinks he has had a couple of brief episodes of atrial fibrillation. Asymptomatic with these episodes but noted elevated rates when checking vitals. No palpitations, lightheadedness, dizziness, or syncope. Able to complete > 4.0 METS of activity.   Per pharmacy and office protocol, "patient can hold Xarelto for 3 days prior to procedure." This should be resumed as soon as possible following procedure.   Given history of atrial fibrillation, recommend continuing Metoprolol and Multaq throughout peri-operative period.   I will route this recommendation to the requesting party via Epic fax function and remove from pre-op pool.  Please call with questions.  Darreld Mclean, PA-C 08/21/2019, 5:21 PM

## 2019-08-21 NOTE — Telephone Encounter (Signed)
Reviewed most recent EKG from PCP's office on 08/08/2019: Looks like atrial fibrillation which he has a history of. Rate controlled. Some P waves can be seen but not consistently before every QRS so I think this may be atrial fibrillation. Also has some PVCs. No new ST/T changes compared to prior tracings which is what I wanted to make sure of. Therefore, Ok to proceed with surgery.

## 2019-08-21 NOTE — Telephone Encounter (Signed)
Patient's wife is returning call to follow up regarding clearance. Please call.

## 2019-08-24 DIAGNOSIS — M7541 Impingement syndrome of right shoulder: Secondary | ICD-10-CM | POA: Diagnosis not present

## 2019-08-24 DIAGNOSIS — M7551 Bursitis of right shoulder: Secondary | ICD-10-CM | POA: Diagnosis not present

## 2019-08-24 DIAGNOSIS — Z01818 Encounter for other preprocedural examination: Secondary | ICD-10-CM | POA: Diagnosis not present

## 2019-08-25 DIAGNOSIS — H5213 Myopia, bilateral: Secondary | ICD-10-CM | POA: Diagnosis not present

## 2019-08-28 DIAGNOSIS — Z951 Presence of aortocoronary bypass graft: Secondary | ICD-10-CM | POA: Diagnosis not present

## 2019-08-28 DIAGNOSIS — M25511 Pain in right shoulder: Secondary | ICD-10-CM | POA: Diagnosis not present

## 2019-08-28 DIAGNOSIS — S43431A Superior glenoid labrum lesion of right shoulder, initial encounter: Secondary | ICD-10-CM | POA: Diagnosis not present

## 2019-08-28 DIAGNOSIS — I10 Essential (primary) hypertension: Secondary | ICD-10-CM | POA: Diagnosis not present

## 2019-08-28 DIAGNOSIS — Z87891 Personal history of nicotine dependence: Secondary | ICD-10-CM | POA: Diagnosis not present

## 2019-08-28 DIAGNOSIS — M7701 Medial epicondylitis, right elbow: Secondary | ICD-10-CM | POA: Diagnosis not present

## 2019-08-28 DIAGNOSIS — I480 Paroxysmal atrial fibrillation: Secondary | ICD-10-CM | POA: Diagnosis not present

## 2019-08-28 DIAGNOSIS — I252 Old myocardial infarction: Secondary | ICD-10-CM | POA: Diagnosis not present

## 2019-08-28 DIAGNOSIS — M7541 Impingement syndrome of right shoulder: Secondary | ICD-10-CM | POA: Diagnosis not present

## 2019-08-28 DIAGNOSIS — I25118 Atherosclerotic heart disease of native coronary artery with other forms of angina pectoris: Secondary | ICD-10-CM | POA: Diagnosis not present

## 2019-08-28 DIAGNOSIS — E785 Hyperlipidemia, unspecified: Secondary | ICD-10-CM | POA: Diagnosis not present

## 2019-08-28 DIAGNOSIS — M19011 Primary osteoarthritis, right shoulder: Secondary | ICD-10-CM | POA: Diagnosis not present

## 2019-08-28 DIAGNOSIS — I2510 Atherosclerotic heart disease of native coronary artery without angina pectoris: Secondary | ICD-10-CM | POA: Diagnosis not present

## 2019-08-28 DIAGNOSIS — G8918 Other acute postprocedural pain: Secondary | ICD-10-CM | POA: Diagnosis not present

## 2019-08-28 DIAGNOSIS — J449 Chronic obstructive pulmonary disease, unspecified: Secondary | ICD-10-CM | POA: Diagnosis not present

## 2019-08-29 ENCOUNTER — Other Ambulatory Visit: Payer: Self-pay | Admitting: *Deleted

## 2019-08-29 ENCOUNTER — Other Ambulatory Visit: Payer: Self-pay | Admitting: Cardiology

## 2019-08-29 MED ORDER — ATORVASTATIN CALCIUM 80 MG PO TABS: ORAL_TABLET | ORAL | 6 refills | Status: DC

## 2019-09-07 DIAGNOSIS — M7541 Impingement syndrome of right shoulder: Secondary | ICD-10-CM | POA: Diagnosis not present

## 2019-09-28 ENCOUNTER — Other Ambulatory Visit: Payer: Self-pay | Admitting: Cardiology

## 2019-10-18 ENCOUNTER — Encounter: Payer: Self-pay | Admitting: Cardiology

## 2019-10-18 ENCOUNTER — Encounter: Payer: Self-pay | Admitting: *Deleted

## 2019-10-18 ENCOUNTER — Ambulatory Visit: Payer: Medicare Other | Admitting: Cardiology

## 2019-10-18 VITALS — BP 122/80 | HR 90 | Ht 70.0 in | Wt 237.2 lb

## 2019-10-18 DIAGNOSIS — E782 Mixed hyperlipidemia: Secondary | ICD-10-CM

## 2019-10-18 DIAGNOSIS — I25119 Atherosclerotic heart disease of native coronary artery with unspecified angina pectoris: Secondary | ICD-10-CM

## 2019-10-18 DIAGNOSIS — I4819 Other persistent atrial fibrillation: Secondary | ICD-10-CM | POA: Diagnosis not present

## 2019-10-18 MED ORDER — RIVAROXABAN 20 MG PO TABS: ORAL_TABLET | ORAL | 0 refills | Status: DC

## 2019-10-18 NOTE — Patient Instructions (Addendum)

## 2019-10-18 NOTE — Progress Notes (Signed)
Cardiology Office Note  Date: 10/18/2019   ID: Jefferson, [name] 16-Sep-1955, MRN [id number]  PCP:  Rory Percy, MD  Cardiologist:  Rozann Lesches, MD Electrophysiologist:  None   Chief Complaint  Patient presents with  . Cardiac follow-up    History of Present Illness: [name] is a 64 y.o. male last assessed via telehealth encounter in February.  He presents for routine visit.  States that he is enjoying retirement, has been doing some woodworking.  He does not report any sense of palpitations, has had no angina symptoms.  He continues to follow-up with Dr. Rayann Patton.  Medications are listed below and include Multaq and Lopressor along with Xarelto.  We discussed follow-up lab work on [place].  He had a follow-up visit for physical with Dr. Nadara Patton in May and had lab work at that time, we will request the results.  He does not report any bleeding problems.  Past Medical History:  Diagnosis Date  . Asthma   . COPD (chronic obstructive pulmonary disease) ([place])   . Coronary atherosclerosis of native coronary artery    a. s/p DES to RCA in 2006 b. DES to LCx in 2012 with CTO of RCA noted at that time  . Hypertension   . Mixed hyperlipidemia   . Myocardial infarction ([place])    NSTEMI 4/12  . Paroxysmal atrial fibrillation ([facility])   . Seasonal allergies   . Skin cancer     Past Surgical History:  Procedure Laterality Date  . CARDIAC CATHETERIZATION N/A 07/26/2014   Procedure: Left Heart Cath and Coronary Angiography;  Surgeon: Edward M Martinique, MD;  Location: [facility];  Service: Cardiovascular;  Laterality: N/A;  . CYSTOSCOPY  01/31/2013  . Nasal reconstruction surgery      Current Outpatient Medications  Medication Sig Dispense Refill  . acetaminophen (TYLENOL) 500 MG tablet Take 1,000 mg by mouth every 4 (four) hours as needed for moderate pain.     [name] albuterol (PROVENTIL) (2.5 MG/3ML) 0.083% nebulizer solution Take 2.5 mg by nebulization as needed for  wheezing or shortness of breath.    [name] atorvastatin (LIPITOR) 80 MG tablet TAKE 1 TABLET(80 MG) BY MOUTH DAILY 30 tablet 6  . ATROVENT HFA 17 MCG/ACT inhaler Inhale 2 puffs into the lungs every 6 (six) hours as needed for wheezing.   0  . guaiFENesin (MUCINEX) 600 MG 12 hr tablet Take 600 mg by mouth 2 (two) times daily.     [name] loratadine (CLARITIN) 10 MG tablet Take 10 mg by mouth daily.      . metoprolol tartrate (LOPRESSOR) 25 MG tablet Take 1 tablet (25 mg total) by mouth 2 (two) times daily. 180 tablet 1  . MULTAQ 400 MG tablet TAKE 1 BY MOUTH TWICE DAILY( EVERY 12 HOURS) WITH A MEAL 180 tablet 1  . Multiple Vitamin (MULTIVITAMIN) tablet Take 1 tablet by mouth daily.      . nitroGLYCERIN (NITROSTAT) 0.4 MG SL tablet place 1 tablet under the tongue every 5 minutes for UP TO 3 doses if needed for angina as directed by prescriber 25 tablet 3  . Omega-3 Fatty Acids (FISH OIL) 1000 MG CAPS Take 2,000 mg by mouth 2 (two) times daily.     [name] QVAR 80 MCG/ACT inhaler Inhale 2 puffs into the lungs 2 (two) times daily.   1  . rivaroxaban (XARELTO) 20 MG TABS tablet TAKE 1 TABLET BY MOUTH DAILY WITH SUPPER 14 tablet 0  . SPIRIVA HANDIHALER  18 MCG inhalation capsule Place 18 mcg into inhaler and inhale daily.   1  . VENTOLIN HFA 108 (90 BASE) MCG/ACT inhaler Inhale 2 puffs into the lungs every 4 (four) hours as needed for wheezing or shortness of breath.   1   No current facility-administered medications for this visit.   Allergies:  Patient has no known allergies.   ROS:   No palpitations.  Physical Exam: VS:  BP 122/80   Pulse 90   Ht 5\' 10"  (1.778 m)   Wt 237 lb 3.2 oz (107.6 kg)   SpO2 93%   BMI 34.03 kg/m , BMI Body mass index is 34.03 kg/m.  Wt Readings from Last 3 Encounters:  10/18/19 237 lb 3.2 oz (107.6 kg)  04/16/19 228 lb (103.4 kg)  03/23/19 245 lb (111.1 kg)    General: Patient appears comfortable at rest. HEENT: Conjunctiva and lids normal, wearing a mask. Neck: Supple, no  elevated JVP or carotid bruits, no thyromegaly. Lungs: Clear to auscultation, nonlabored breathing at rest. Cardiac: Regular rate and rhythm, no S3 or significant systolic murmur, no pericardial rub. Extremities: No pitting edema, distal pulses 2+.  ECG:  An ECG dated 08/08/2019 was personally reviewed today and demonstrated:  Atrial fibrillation with PVCs, nonspecific T wave changes.  Recent Labwork:  May 2020: BUN 15, creatinine 1.1, potassium 4.5, AST 16, ALT 36, cholesterol 107, triglycerides 63, HDL 36, LDL 58, hemoglobin 18.4, platelets 352, TSH 2.35, hemoglobin A1c 5.6%  Other Studies Reviewed Today:  Echocardiogram 07/06/2017: Study Conclusions   - Left ventricle: The cavity size was mildly dilated. Wall  thickness was increased in a pattern of mild LVH. Systolic  function was normal. The estimated ejection fraction was in the  range of 60% to 65%. Possible hypokinesis of the basalinferior  myocardium. Left ventricular diastolic function parameters were  normal for the patient&'s age.  - Aortic valve: Mildly calcified annulus. Trileaflet. There was  mild regurgitation.  - Mitral valve: There was trivial regurgitation.  - Right atrium: Central venous pressure (est): 3 mm Hg.  - Atrial septum: No defect or patent foramen ovale was identified.  - Tricuspid valve: There was trivial regurgitation.  - Pulmonary arteries: Systolic pressure could not be accurately  estimated.  - Pericardium, extracardiac: There was no pericardial effusion.   Assessment and Plan:  1.  Paroxysmal to persistent atrial fibrillation.  It looks like he was in atrial fibrillation with rate control back in May with physical per PCP, heart rate is regular today and he does not report any palpitations.  We will request his lab work and plan to continue [place], [place], and Xarelto.  2.  CAD status post DES to the circumflex with occluded mid RCA, managed medically.  He does not report any  active angina.  No aspirin given use of Xarelto.  Continue beta-blocker and statin.  3.  Mixed hyperlipidemia, remains on Lipitor.  Requesting interval lab work from Dr. Nadara Patton.  Medication Adjustments/Labs and Tests Ordered: Current medicines are reviewed at length with the patient today.  Concerns regarding medicines are outlined above.   Tests Ordered: No orders of the defined types were placed in this encounter.   Medication Changes: Meds ordered this encounter  Medications  . rivaroxaban (XARELTO) 20 MG TABS tablet    Sig: TAKE 1 TABLET BY MOUTH DAILY WITH SUPPER    Dispense:  14 tablet    Refill:  0    Lot # [id number] Exp 01/2020    Disposition:  Follow up 6 months in the [place] office.  Signed, Satira Sark, MD, [facility] 10/18/2019 2:17 PM    [facility] [place], [place], [place] [address] Phone: [phone]; Fax: [phone]

## 2020-01-03 DIAGNOSIS — M12811 Other specific arthropathies, not elsewhere classified, right shoulder: Secondary | ICD-10-CM | POA: Diagnosis not present

## 2020-01-03 DIAGNOSIS — M25511 Pain in right shoulder: Secondary | ICD-10-CM | POA: Diagnosis not present

## 2020-01-03 DIAGNOSIS — M7541 Impingement syndrome of right shoulder: Secondary | ICD-10-CM | POA: Diagnosis not present

## 2020-01-09 DIAGNOSIS — M12811 Other specific arthropathies, not elsewhere classified, right shoulder: Secondary | ICD-10-CM | POA: Diagnosis not present

## 2020-01-09 DIAGNOSIS — Z9889 Other specified postprocedural states: Secondary | ICD-10-CM | POA: Diagnosis not present

## 2020-01-09 DIAGNOSIS — M25511 Pain in right shoulder: Secondary | ICD-10-CM | POA: Diagnosis not present

## 2020-01-09 DIAGNOSIS — M7541 Impingement syndrome of right shoulder: Secondary | ICD-10-CM | POA: Diagnosis not present

## 2020-01-09 DIAGNOSIS — M25611 Stiffness of right shoulder, not elsewhere classified: Secondary | ICD-10-CM | POA: Diagnosis not present

## 2020-01-09 DIAGNOSIS — R29898 Other symptoms and signs involving the musculoskeletal system: Secondary | ICD-10-CM | POA: Diagnosis not present

## 2020-01-09 DIAGNOSIS — Z7409 Other reduced mobility: Secondary | ICD-10-CM | POA: Diagnosis not present

## 2020-01-22 DIAGNOSIS — Z23 Encounter for immunization: Secondary | ICD-10-CM | POA: Diagnosis not present

## 2020-01-28 ENCOUNTER — Other Ambulatory Visit: Payer: Self-pay | Admitting: *Deleted

## 2020-01-28 MED ORDER — MULTAQ 400 MG PO TABS: ORAL_TABLET | ORAL | 2 refills | Status: DC

## 2020-02-12 DIAGNOSIS — M19041 Primary osteoarthritis, right hand: Secondary | ICD-10-CM | POA: Diagnosis not present

## 2020-02-12 DIAGNOSIS — Z87891 Personal history of nicotine dependence: Secondary | ICD-10-CM | POA: Diagnosis not present

## 2020-02-12 DIAGNOSIS — E7800 Pure hypercholesterolemia, unspecified: Secondary | ICD-10-CM | POA: Diagnosis not present

## 2020-02-12 DIAGNOSIS — J441 Chronic obstructive pulmonary disease with (acute) exacerbation: Secondary | ICD-10-CM | POA: Diagnosis not present

## 2020-02-25 ENCOUNTER — Other Ambulatory Visit: Payer: Self-pay | Admitting: Cardiology

## 2020-03-14 DIAGNOSIS — J441 Chronic obstructive pulmonary disease with (acute) exacerbation: Secondary | ICD-10-CM | POA: Diagnosis not present

## 2020-03-14 DIAGNOSIS — Z87891 Personal history of nicotine dependence: Secondary | ICD-10-CM | POA: Diagnosis not present

## 2020-03-14 DIAGNOSIS — M19041 Primary osteoarthritis, right hand: Secondary | ICD-10-CM | POA: Diagnosis not present

## 2020-03-14 DIAGNOSIS — E7800 Pure hypercholesterolemia, unspecified: Secondary | ICD-10-CM | POA: Diagnosis not present

## 2020-03-21 ENCOUNTER — Telehealth: Payer: Self-pay | Admitting: *Deleted

## 2020-03-21 ENCOUNTER — Ambulatory Visit (INDEPENDENT_AMBULATORY_CARE_PROVIDER_SITE_OTHER): Payer: Medicare Other | Admitting: Internal Medicine

## 2020-03-21 ENCOUNTER — Encounter: Payer: Self-pay | Admitting: Internal Medicine

## 2020-03-21 ENCOUNTER — Telehealth: Payer: Self-pay | Admitting: Internal Medicine

## 2020-03-21 ENCOUNTER — Encounter: Payer: Self-pay | Admitting: *Deleted

## 2020-03-21 VITALS — BP 132/80 | HR 113 | Resp 16 | Ht 70.0 in | Wt 260.8 lb

## 2020-03-21 DIAGNOSIS — I25119 Atherosclerotic heart disease of native coronary artery with unspecified angina pectoris: Secondary | ICD-10-CM

## 2020-03-21 DIAGNOSIS — I4819 Other persistent atrial fibrillation: Secondary | ICD-10-CM | POA: Diagnosis not present

## 2020-03-21 DIAGNOSIS — Z720 Tobacco use: Secondary | ICD-10-CM

## 2020-03-21 DIAGNOSIS — D6869 Other thrombophilia: Secondary | ICD-10-CM

## 2020-03-21 NOTE — H&P (View-Only) (Signed)
 PCP: Howard, Kevin, MD   Primary EP: Dr Modell Fendrick  [name] is a 65 y.o. male who presents today for routine electrophysiology followup.  Since last being seen in our clinic, the patient reports doing reasonably well. He has been persistently in afib for quite some time.  + fatigue, SOB and decreased exercise tolerance.  Today, he denies symptoms of palpitations, chest pain, lower extremity edema, dizziness, presyncope, or syncope.  The patient is otherwise without complaint today.   Past Medical History:  Diagnosis Date  . Asthma   . COPD (chronic obstructive pulmonary disease) ([facility])   . Coronary atherosclerosis of native coronary artery    a. s/p DES to RCA in 2006 b. DES to LCx in 2012 with CTO of RCA noted at that time  . Hypertension   . Mixed hyperlipidemia   . Myocardial infarction ([facility])    NSTEMI 4/12  . Paroxysmal atrial fibrillation ([facility])   . Seasonal allergies   . Skin cancer    Past Surgical History:  Procedure Laterality Date  . CARDIAC CATHETERIZATION N/A 07/26/2014   Procedure: Left Heart Cath and Coronary Angiography;  Surgeon: Peter M Jordan, MD;  Location: [facility];  Service: Cardiovascular;  Laterality: N/A;  . CYSTOSCOPY  01/31/2013  . Nasal reconstruction surgery      ROS- all systems are reviewed and negatives except as per HPI above  Current Outpatient Medications  Medication Sig Dispense Refill  . acetaminophen (TYLENOL) 500 MG tablet Take 1,000 mg by mouth every 4 (four) hours as needed for moderate pain.     . albuterol (PROVENTIL) (2.5 MG/3ML) 0.083% nebulizer solution Take 2.5 mg by nebulization as needed for wheezing or shortness of breath.    . atorvastatin (LIPITOR) 80 MG tablet TAKE 1 TABLET(80 MG) BY MOUTH DAILY 30 tablet 6  . ATROVENT HFA 17 MCG/ACT inhaler Inhale 2 puffs into the lungs every 6 (six) hours as needed for wheezing.   0  . dronedarone (MULTAQ) 400 MG tablet TAKE 1 BY MOUTH TWICE DAILY( EVERY 12 HOURS) WITH A MEAL  180 tablet 2  . guaiFENesin (MUCINEX) 600 MG 12 hr tablet Take 600 mg by mouth 2 (two) times daily.     . loratadine (CLARITIN) 10 MG tablet Take 10 mg by mouth daily.    . metoprolol tartrate (LOPRESSOR) 25 MG tablet Take 1 tablet (25 mg total) by mouth 2 (two) times daily. 180 tablet 1  . Multiple Vitamin (MULTIVITAMIN) tablet Take 1 tablet by mouth daily.    . nitroGLYCERIN (NITROSTAT) 0.4 MG SL tablet place 1 tablet under the tongue every 5 minutes for UP TO 3 doses if needed for angina as directed by prescriber 25 tablet 3  . Omega-3 Fatty Acids (FISH OIL) 1000 MG CAPS Take 2,000 mg by mouth 2 (two) times daily.    . QVAR 80 MCG/ACT inhaler Inhale 2 puffs into the lungs 2 (two) times daily.   1  . rivaroxaban (XARELTO) 20 MG TABS tablet TAKE 1 TABLET BY MOUTH DAILY WITH SUPPER 14 tablet 0  . SPIRIVA HANDIHALER 18 MCG inhalation capsule Place 18 mcg into inhaler and inhale daily.   1  . VENTOLIN HFA 108 (90 BASE) MCG/ACT inhaler Inhale 2 puffs into the lungs every 4 (four) hours as needed for wheezing or shortness of breath.   1   No current facility-administered medications for this visit.    Physical Exam: Vitals:   03/21/20 0902  BP: 132/80  Pulse: (!)   113  Resp: 16  SpO2: 95%  Weight: 260 lb 12.8 oz (118.3 kg)  Height: 5\' 10"  (1.778 m)    GEN- The patient is overweight appearing, alert and oriented x 3 today.   Head- normocephalic, atraumatic Eyes-  Sclera clear, conjunctiva pink Ears- hearing intact Oropharynx- clear Lungs-  , normal work of breathing Heart- irregular rate and rhythm  GI- soft, NT, ND, + BS Extremities- no clubbing, cyanosis, or edema  Wt Readings from Last 3 Encounters:  03/21/20 260 lb 12.8 oz (118.3 kg)  10/18/19 237 lb 3.2 oz (107.6 kg)  04/16/19 228 lb (103.4 kg)    EKG tracing ordered today is personally reviewed and shows afib  Assessment and Plan:  1. Persistent atrial fibrillation chads2vasc score is 3.  He is on xarelto He is on  multaq Therapeutic strategies for afib including medicine (tikosyn, amiodarone) and ablation were discussed in detail with the patient today. Risk, benefits, and alternatives to each approach were discussed.  He would like to continue multaq but proceed with cardioversion. Risks of cardioversion including but not limited to anesthesia and stroke were discussed.  He accepts risks and will proceed.  If he fails medical therapy post cardioversion with multaq, we will consider ablation. Bmet, cbc, lfts ordered today  2. HTN Stable No change required today bmet  3. CAD No ischemic symptoms  4. Tobacco He quit x 7 months!  5. Overweight Body mass index is 37.42 kg/m. Lifestyle modification advised  Risks, benefits and potential toxicities for medications prescribed and/or refilled reviewed with patient today.   Return to AF clinic 1 week after [facility] I will see in 3 months  Thompson Grayer MD, [facility] 03/21/2020 9:31 AM

## 2020-03-21 NOTE — Telephone Encounter (Signed)
Pre-cert Verification for the following procedure     DCCV at [facility] on 04/03/2020 @9 :30 am with Dr. Domenic Polite dx: A-Fib

## 2020-03-21 NOTE — Telephone Encounter (Signed)
Patient informed of DCCV at [facility] on 04/03/2020 @9 :30 am with Dr. Domenic Polite dx: A-Fib Pre-Admission test 04/01/2020 @1 :45 pm at [facility] Aware to quarantine after covid test until DCCV is completed

## 2020-03-21 NOTE — Progress Notes (Signed)
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  Since last being seen in our clinic, the patient reports doing reasonably well. He has been persistently in afib for quite some time.  + fatigue, SOB and decreased exercise tolerance.  Today, he denies symptoms of palpitations, chest pain, lower extremity edema, dizziness, presyncope, or syncope.  The patient is otherwise without complaint today.   Past Medical History:  Diagnosis Date  . Asthma   . COPD (chronic obstructive pulmonary disease) ([place])   . Coronary atherosclerosis of native coronary artery    a. s/p DES to RCA in 2006 b. DES to LCx in 2012 with CTO of RCA noted at that time  . Hypertension   . Mixed hyperlipidemia   . Myocardial infarction ([place])    NSTEMI 4/12  . Paroxysmal atrial fibrillation ([facility])   . Seasonal allergies   . Skin cancer    Past Surgical History:  Procedure Laterality Date  . CARDIAC CATHETERIZATION N/A 07/26/2014   Procedure: Left Heart Cath and Coronary Angiography;  Surgeon: Peter M Martinique, MD;  Location: [facility];  Service: Cardiovascular;  Laterality: N/A;  . CYSTOSCOPY  01/31/2013  . Nasal reconstruction surgery      ROS- all systems are reviewed and negatives except as per HPI above  Current Outpatient Medications  Medication Sig Dispense Refill  . acetaminophen (TYLENOL) 500 MG tablet Take 1,000 mg by mouth every 4 (four) hours as needed for moderate pain.     [name] albuterol (PROVENTIL) (2.5 MG/3ML) 0.083% nebulizer solution Take 2.5 mg by nebulization as needed for wheezing or shortness of breath.    [name] atorvastatin (LIPITOR) 80 MG tablet TAKE 1 TABLET(80 MG) BY MOUTH DAILY 30 tablet 6  . ATROVENT HFA 17 MCG/ACT inhaler Inhale 2 puffs into the lungs every 6 (six) hours as needed for wheezing.   0  . dronedarone (MULTAQ) 400 MG tablet TAKE 1 BY MOUTH TWICE DAILY( EVERY 12 HOURS) WITH A MEAL  180 tablet 2  . guaiFENesin (MUCINEX) 600 MG 12 hr tablet Take 600 mg by mouth 2 (two) times daily.     [name] loratadine (CLARITIN) 10 MG tablet Take 10 mg by mouth daily.    . metoprolol tartrate (LOPRESSOR) 25 MG tablet Take 1 tablet (25 mg total) by mouth 2 (two) times daily. 180 tablet 1  . Multiple Vitamin (MULTIVITAMIN) tablet Take 1 tablet by mouth daily.    . nitroGLYCERIN (NITROSTAT) 0.4 MG SL tablet place 1 tablet under the tongue every 5 minutes for UP TO 3 doses if needed for angina as directed by prescriber 25 tablet 3  . Omega-3 Fatty Acids (FISH OIL) 1000 MG CAPS Take 2,000 mg by mouth 2 (two) times daily.    [name] QVAR 80 MCG/ACT inhaler Inhale 2 puffs into the lungs 2 (two) times daily.   1  . rivaroxaban (XARELTO) 20 MG TABS tablet TAKE 1 TABLET BY MOUTH DAILY WITH SUPPER 14 tablet 0  . SPIRIVA HANDIHALER 18 MCG inhalation capsule Place 18 mcg into inhaler and inhale daily.   1  . VENTOLIN HFA 108 (90 BASE) MCG/ACT inhaler Inhale 2 puffs into the lungs every 4 (four) hours as needed for wheezing or shortness of breath.   1   No current facility-administered medications for this visit.    Physical Exam: Vitals:   03/21/20 0902  BP: 132/80  Pulse: [name])  113  Resp: 16  SpO2: 95%  Weight: 260 lb 12.8 oz (118.3 kg)  Height: 5\' 10"  (1.778 m)    GEN- The patient is overweight appearing, alert and oriented x 3 today.   Head- normocephalic, atraumatic Eyes-  Sclera clear, conjunctiva pink Ears- hearing intact Oropharynx- clear Lungs-  , normal work of breathing Heart- irregular rate and rhythm  GI- soft, NT, ND, + BS Extremities- no clubbing, cyanosis, or edema  Wt Readings from Last 3 Encounters:  03/21/20 260 lb 12.8 oz (118.3 kg)  10/18/19 237 lb 3.2 oz (107.6 kg)  04/16/19 228 lb (103.4 kg)    EKG tracing ordered today is personally reviewed and shows afib  Assessment and Plan:  1. Persistent atrial fibrillation chads2vasc score is 3.  He is on xarelto He is on  multaq Therapeutic strategies for afib including medicine (tikosyn, amiodarone) and ablation were discussed in detail with the patient today. Risk, benefits, and alternatives to each approach were discussed.  He would like to continue multaq but proceed with cardioversion. Risks of cardioversion including but not limited to anesthesia and stroke were discussed.  He accepts risks and will proceed.  If he fails medical therapy post cardioversion with multaq, we will consider ablation. Bmet, cbc, lfts ordered today  2. HTN Stable No change required today bmet  3. CAD No ischemic symptoms  4. Tobacco He quit x 7 months!  5. Overweight Body mass index is 37.42 kg/m. Lifestyle modification advised  Risks, benefits and potential toxicities for medications prescribed and/or refilled reviewed with patient today.   Return to AF clinic 1 week after [facility] I will see in 3 months  Thompson Grayer MD, [facility] 03/21/2020 9:31 AM

## 2020-03-21 NOTE — Patient Instructions (Addendum)
Medication Instructions:   Your physician recommends that you continue on your current medications as directed. Please refer to the Current Medication list given to you today.  Labwork:  Pre-Admission lab work and covid [employer] will be contacted with date once its scheduled  Testing/Procedures: Your physician has recommended that you have a Cardioversion (DCCV). Electrical Cardioversion uses a jolt of electricity to your heart either through paddles or wired patches attached to your chest. This is a controlled, usually prescheduled, procedure. Defibrillation is done under light anesthesia in the hospital, and you usually go home the day of the procedure. This is done to get your heart back into a normal rhythm. You are not awake for the procedure. Please see the instruction sheet given to you today.  You will be contacted with date once its scheduled  Follow-Up:  Your physician recommends that you schedule a follow-up appointment in: 3 months with Dr. Rayann Heman  Your physician recommends that you schedule a follow-up appointment in: 1 months after cardioversion in A-Fib Clinic  Any Other Special Instructions Will Be Listed Below (If Applicable).  If you need a refill on your cardiac medications before your next appointment, please call your pharmacy.

## 2020-03-25 ENCOUNTER — Telehealth: Payer: Self-pay | Admitting: Cardiology

## 2020-03-25 NOTE — Telephone Encounter (Signed)
Patient called wanting to know if he needs to have Multaq 400 mg refilled.

## 2020-03-25 NOTE — Telephone Encounter (Signed)
Advised that multaq should be re-filled. Verbalized understanding

## 2020-03-26 ENCOUNTER — Other Ambulatory Visit: Payer: Self-pay | Admitting: Cardiology

## 2020-03-28 NOTE — Patient Instructions (Signed)
[name]  03/28/2020     @PREFPERIOPPHARMACY @   Your procedure is scheduled on  04/03/2020.  Report to [facility] at  0800  A.M.  Call this number if you have problems the morning of surgery:  [phone]   Remember:  Do not eat or drink after midnight.                       Take these medicines the morning of surgery with A SIP OF WATER dronedarone. Use your nebulizer and your inhaler before you come.                          DO NOT miss any doses of your eliquis.     Do not wear jewelry, make-up or nail polish.  Do not wear lotions, powders, or perfumes, or deodorant. Please brush your teeth  Do not shave 48 hours prior to surgery.  Men may shave face and neck.  Do not bring valuables to the hospital.  [facility] is not responsible for any belongings or valuables.  Contacts, dentures or bridgework may not be worn into surgery.  Leave your suitcase in the car.  After surgery it may be brought to your room.  For patients admitted to the hospital, discharge time will be determined by your treatment team.  Patients discharged the day of surgery will not be allowed to drive home.   Name and phone number of your driver:   Family   Special instructions:  DO NOT smoke the morning of your procedure.  Please read over the following fact sheets that you were given. Anesthesia Post-op Instructions and Care and Recovery After Surgery       Electrical Cardioversion Electrical cardioversion is the delivery of a jolt of electricity to restore a normal rhythm to the heart. A rhythm that is too fast or is not regular keeps the heart from pumping well. In this procedure, sticky patches or metal paddles are placed on the chest to deliver electricity to the heart from a device. This procedure may be done in an emergency if:  There is low or no blood pressure as a result of the heart rhythm.  Normal rhythm must be restored as fast as possible to protect the brain and  heart from further damage.  It may save a life. This may also be a scheduled procedure for irregular or fast heart rhythms that are not immediately life-threatening. Tell a health care provider about:  Any allergies you have.  All medicines you are taking, including vitamins, herbs, eye drops, creams, and over-the-counter medicines.  Any problems you or family members have had with anesthetic medicines.  Any blood disorders you have.  Any surgeries you have had.  Any medical conditions you have.  Whether you are pregnant or may be pregnant. What are the risks? Generally, this is a safe procedure. However, problems may occur, including:  Allergic reactions to medicines.  A blood clot that breaks free and travels to other parts of your body.  The possible return of an abnormal heart rhythm within hours or days after the procedure.  Your heart stopping (cardiac arrest). This is rare. What happens before the procedure? Medicines  Your health care provider may have you start taking: ? Blood-thinning medicines (anticoagulants) so your blood does not clot as easily. ? Medicines to help stabilize your heart rate and rhythm.  Ask your  health care provider about: ? Changing or stopping your regular medicines. This is especially important if you are taking diabetes medicines or blood thinners. ? Taking medicines such as aspirin and ibuprofen. These medicines can thin your blood. Do not take these medicines unless your health care provider tells you to take them. ? Taking over-the-counter medicines, vitamins, herbs, and supplements. General instructions  Follow instructions from your health care provider about eating or drinking restrictions.  Plan to have someone take you home from the hospital or clinic.  If you will be going home right after the procedure, plan to have someone with you for 24 hours.  Ask your health care provider what steps will be taken to help prevent  infection. These may include washing your skin with a germ-killing soap. What happens during the procedure?  An IV will be inserted into one of your veins.  Sticky patches (electrodes) or metal paddles may be placed on your chest.  You will be given a medicine to help you relax (sedative).  An electrical shock will be delivered. The procedure may vary among health care providers and hospitals.   What can I expect after the procedure?  Your blood pressure, heart rate, breathing rate, and blood oxygen level will be monitored until you leave the hospital or clinic.  Your heart rhythm will be watched to make sure it does not change.  You may have some redness on the skin where the shocks were given. Follow these instructions at home:  Do not drive for 24 hours if you were given a sedative during your procedure.  Take over-the-counter and prescription medicines only as told by your health care provider.  Ask your health care provider how to check your pulse. Check it often.  Rest for 48 hours after the procedure or as told by your health care provider.  Avoid or limit your caffeine use as told by your health care provider.  Keep all follow-up visits as told by your health care provider. This is important. Contact a health care provider if:  You feel like your heart is beating too quickly or your pulse is not regular.  You have a serious muscle cramp that does not go away. Get help right away if:  You have discomfort in your chest.  You are dizzy or you feel faint.  You have trouble breathing or you are short of breath.  Your speech is slurred.  You have trouble moving an arm or leg on one side of your body.  Your fingers or toes turn cold or blue. Summary  Electrical cardioversion is the delivery of a jolt of electricity to restore a normal rhythm to the heart.  This procedure may be done right away in an emergency or may be a scheduled procedure if the condition is  not an emergency.  Generally, this is a safe procedure.  After the procedure, check your pulse often as told by your health care provider. This information is not intended to replace advice given to you by your health care provider. Make sure you discuss any questions you have with your health care provider. Document Revised: 10/02/2018 Document Reviewed: 10/02/2018 Elsevier Patient Education  2021 [place] After This sheet gives you information about how to care for yourself after your procedure. Your health care provider may also give you more specific instructions. If you have problems or questions, contact your health care provider. What can I expect after the procedure? After the procedure,  it is common to have:  Tiredness.  Forgetfulness about what happened after the procedure.  Impaired judgment for important decisions.  Nausea or vomiting.  Some difficulty with balance. Follow these instructions at home: For the time period you were told by your health care provider:  Rest as needed.  Do not participate in activities where you could fall or become injured.  Do not drive or use machinery.  Do not drink alcohol.  Do not take sleeping pills or medicines that cause drowsiness.  Do not make important decisions or sign legal documents.  Do not take care of children on your own.      Eating and drinking  Follow the diet that is recommended by your health care provider.  Drink enough fluid to keep your urine pale yellow.  If you vomit: ? Drink water, juice, or soup when you can drink without vomiting. ? Make sure you have little or no nausea before eating solid foods. General instructions  Have a responsible adult stay with you for the time you are told. It is important to have someone help care for you until you are awake and alert.  Take over-the-counter and prescription medicines only as told by your health care  provider.  If you have sleep apnea, surgery and certain medicines can increase your risk for breathing problems. Follow instructions from your health care provider about wearing your sleep device: ? Anytime you are sleeping, including during daytime naps. ? While taking prescription pain medicines, sleeping medicines, or medicines that make you drowsy.  Avoid smoking.  Keep all follow-up visits as told by your health care provider. This is important. Contact a health care provider if:  You keep feeling nauseous or you keep vomiting.  You feel light-headed.  You are still sleepy or having trouble with balance after 24 hours.  You develop a rash.  You have a fever.  You have redness or swelling around the IV site. Get help right away if:  You have trouble breathing.  You have new-onset confusion at home. Summary  For several hours after your procedure, you may feel tired. You may also be forgetful and have poor judgment.  Have a responsible adult stay with you for the time you are told. It is important to have someone help care for you until you are awake and alert.  Rest as told. Do not drive or operate machinery. Do not drink alcohol or take sleeping pills.  Get help right away if you have trouble breathing, or if you suddenly become confused. This information is not intended to replace advice given to you by your health care provider. Make sure you discuss any questions you have with your health care provider. Document Revised: 11/15/2019 Document Reviewed: 02/01/2019 Elsevier Patient Education  2021 [employer].

## 2020-04-01 ENCOUNTER — Encounter (HOSPITAL_COMMUNITY): Payer: Self-pay

## 2020-04-01 ENCOUNTER — Encounter (HOSPITAL_COMMUNITY)
Admission: RE | Admit: 2020-04-01 | Discharge: 2020-04-01 | Disposition: A | Discharge status: Home / Self Care | Payer: Medicare Other | Source: Ambulatory Visit | Attending: Cardiology | Admitting: Cardiology

## 2020-04-01 ENCOUNTER — Other Ambulatory Visit: Payer: Self-pay

## 2020-04-01 ENCOUNTER — Other Ambulatory Visit (HOSPITAL_COMMUNITY)
Admission: RE | Admit: 2020-04-01 | Discharge: 2020-04-01 | Disposition: A | Discharge status: Home / Self Care | Payer: Medicare Other | Source: Ambulatory Visit | Attending: Cardiology | Admitting: Cardiology

## 2020-04-01 DIAGNOSIS — Z20822 Contact with and (suspected) exposure to covid-19: Secondary | ICD-10-CM | POA: Insufficient documentation

## 2020-04-01 DIAGNOSIS — Z01812 Encounter for preprocedural laboratory examination: Secondary | ICD-10-CM | POA: Insufficient documentation

## 2020-04-01 LAB — COMPREHENSIVE METABOLIC PANEL
ALT: 42 U/L (ref 0–44)
AST: 23 U/L (ref 15–41)
Albumin: 3.7 g/dL (ref 3.5–5.0)
Alkaline Phosphatase: 64 U/L (ref 38–126)
Anion gap: 6 (ref 5–15)
BUN: 14 mg/dL (ref 8–23)
CO2: 25 mmol/L (ref 22–32)
Calcium: 8.6 mg/dL — ABNORMAL LOW (ref 8.9–10.3)
Chloride: 106 mmol/L (ref 98–111)
Creatinine, Ser: 0.95 mg/dL (ref 0.61–1.24)
GFR, Estimated: >60 mL/min (ref >60)
Glucose, Bld: 114 mg/dL — ABNORMAL HIGH (ref 70–99)
Potassium: 4 mmol/L (ref 3.5–5.1)
Sodium: 137 mmol/L (ref 135–145)
Total Bilirubin: 0.9 mg/dL (ref 0.3–1.2)
Total Protein: 7.1 g/dL (ref 6.5–8.1)

## 2020-04-01 LAB — PROTIME-INR
INR: 2.3 — ABNORMAL HIGH (ref 0.8–1.2)
Prothrombin Time: 24.1 seconds — ABNORMAL HIGH (ref 11.4–15.2)

## 2020-04-02 LAB — SARS CORONAVIRUS 2 (TAT 6-24 HRS): SARS Coronavirus 2: NEGATIVE

## 2020-04-03 ENCOUNTER — Encounter (HOSPITAL_COMMUNITY): Payer: Self-pay | Admitting: Cardiology

## 2020-04-03 ENCOUNTER — Ambulatory Visit (HOSPITAL_COMMUNITY): Payer: Medicare Other | Admitting: Anesthesiology

## 2020-04-03 ENCOUNTER — Other Ambulatory Visit: Payer: Self-pay

## 2020-04-03 ENCOUNTER — Telehealth: Payer: Self-pay | Admitting: Cardiology

## 2020-04-03 ENCOUNTER — Encounter (HOSPITAL_COMMUNITY)
Admission: RE | Disposition: A | Discharge status: Home / Self Care | Payer: Self-pay | Source: Home / Self Care | Attending: Cardiology

## 2020-04-03 ENCOUNTER — Ambulatory Visit (HOSPITAL_COMMUNITY)
Admission: RE | Admit: 2020-04-03 | Discharge: 2020-04-03 | Disposition: A | Discharge status: Home / Self Care | Payer: Medicare Other | Attending: Cardiology | Admitting: Cardiology

## 2020-04-03 DIAGNOSIS — Z87891 Personal history of nicotine dependence: Secondary | ICD-10-CM | POA: Diagnosis not present

## 2020-04-03 DIAGNOSIS — Z6837 Body mass index (BMI) 37.0-37.9, adult: Secondary | ICD-10-CM | POA: Diagnosis not present

## 2020-04-03 DIAGNOSIS — E663 Overweight: Secondary | ICD-10-CM | POA: Insufficient documentation

## 2020-04-03 DIAGNOSIS — Z7951 Long term (current) use of inhaled steroids: Secondary | ICD-10-CM | POA: Diagnosis not present

## 2020-04-03 DIAGNOSIS — I10 Essential (primary) hypertension: Secondary | ICD-10-CM | POA: Insufficient documentation

## 2020-04-03 DIAGNOSIS — I4819 Other persistent atrial fibrillation: Secondary | ICD-10-CM

## 2020-04-03 DIAGNOSIS — Z7901 Long term (current) use of anticoagulants: Secondary | ICD-10-CM | POA: Diagnosis not present

## 2020-04-03 DIAGNOSIS — I2510 Atherosclerotic heart disease of native coronary artery without angina pectoris: Secondary | ICD-10-CM | POA: Insufficient documentation

## 2020-04-03 DIAGNOSIS — Z79899 Other long term (current) drug therapy: Secondary | ICD-10-CM | POA: Diagnosis not present

## 2020-04-03 DIAGNOSIS — I4891 Unspecified atrial fibrillation: Secondary | ICD-10-CM | POA: Diagnosis not present

## 2020-04-03 HISTORY — PX: CARDIOVERSION: SHX1299

## 2020-04-03 SURGERY — CARDIOVERSION
Anesthesia: General

## 2020-04-03 MED ORDER — HYDROCORTISONE 1 % EX CREA
TOPICAL_CREAM | Freq: Two times a day (BID) | CUTANEOUS | Status: DC
  Administered 2020-04-03: 1 via TOPICAL
  Filled 2020-04-03: qty 28

## 2020-04-03 MED ORDER — PROPOFOL 10 MG/ML IV BOLUS
INTRAVENOUS | Status: DC | PRN
  Administered 2020-04-03: 100 mg via INTRAVENOUS
  Administered 2020-04-03: 50 mg via INTRAVENOUS

## 2020-04-03 MED ORDER — ORAL CARE MOUTH RINSE: 15.0000 mL | Freq: Once | OROMUCOSAL | Status: AC

## 2020-04-03 MED ORDER — LACTATED RINGERS IV SOLN: INTRAVENOUS | Status: DC

## 2020-04-03 MED ORDER — PROPOFOL 10 MG/ML IV BOLUS
INTRAVENOUS | Status: AC
  Filled 2020-04-03: qty 20

## 2020-04-03 MED ORDER — CHLORHEXIDINE GLUCONATE 0.12 % MT SOLN
15.0000 mL | Freq: Once | OROMUCOSAL | Status: AC
  Administered 2020-04-03: 15 mL via OROMUCOSAL

## 2020-04-03 NOTE — Anesthesia Postprocedure Evaluation (Signed)
Anesthesia Post Note  Patient: [name]  Procedure(s) Performed: CARDIOVERSION (N/A )  Patient location during evaluation: PACU Anesthesia Type: General Level of consciousness: awake and oriented Pain management: pain level controlled Vital Signs Assessment: post-procedure vital signs reviewed and stable Respiratory status: spontaneous breathing and respiratory function stable Cardiovascular status: stable and blood pressure returned to baseline Postop Assessment: no apparent nausea or vomiting Anesthetic complications: no   No complications documented.   Last Vitals:  Vitals:   04/03/20 0930 04/03/20 0945  BP: 91/67 101/73  Pulse: 99   Resp: 19 (!) 22  Temp: 36.9 C   SpO2: 95%     Last Pain:  Vitals:   04/03/20 0806  TempSrc: Oral  PainSc: 0-No pain                 Karna Dupes

## 2020-04-03 NOTE — Transfer of Care (Signed)
Immediate Anesthesia Transfer of Care Note  Patient: [name]  Procedure(s) Performed: CARDIOVERSION (N/A )  Patient Location: PACU  Anesthesia Type:MAC  Level of Consciousness: drowsy  Airway & Oxygen Therapy: Patient Spontanous Breathing and Patient connected to nasal cannula oxygen  Post-op Assessment: Report given to RN and Post -op Vital signs reviewed and stable  Post vital signs: Reviewed and stable  Last Vitals:  Vitals Value Taken Time  BP 93/61   Temp 98.4   Pulse 105   Resp 20   SpO2 95     Last Pain:  Vitals:   04/03/20 0806  TempSrc: Oral  PainSc: 0-No pain      Patients Stated Pain Goal: 5 (09/73/53 2992)  Complications: No complications documented.

## 2020-04-03 NOTE — Anesthesia Preprocedure Evaluation (Signed)
Anesthesia Evaluation  Patient identified by MRN, date of birth, ID band Patient awake    Reviewed: Allergy & Precautions, NPO status , Patient's Chart, lab work & pertinent test results, reviewed documented beta blocker date and time   History of Anesthesia Complications Negative for: history of anesthetic complications  Airway Mallampati: II  TM Distance: >3 FB Neck ROM: Full    Dental  (+) Edentulous Upper, Edentulous Lower   Pulmonary shortness of breath and with exertion, asthma , COPD, former smoker,    Pulmonary exam normal breath sounds clear to auscultation       Cardiovascular Exercise Tolerance: Poor hypertension, Pt. on home beta blockers and Pt. on medications + angina with exertion + CAD, + Past MI and + Cardiac Stents  + dysrhythmias Atrial Fibrillation  Rhythm:Irregular Rate:Tachycardia  Left ventricle: The cavity size was mildly dilated. Wall  thickness was increased in a pattern of mild LVH. Systolic  function was normal. The estimated ejection fraction was in the  range of 60% to 65%. Possible hypokinesis of the basalinferior  myocardium. Left ventricular diastolic function parameters were  normal for the patient&'s age.  - Aortic valve: Mildly calcified annulus. Trileaflet. There was  mild regurgitation.  - Mitral valve: There was trivial regurgitation.  - Right atrium: Central venous pressure (est): 3 mm Hg.  - Atrial septum: No defect or patent foramen ovale was identified.  - Tricuspid valve: There was trivial regurgitation.  - Pulmonary arteries: Systolic pressure could not be accurately  estimated.  - Pericardium, extracardiac: There was no pericardial effusion.   Neuro/Psych negative neurological ROS  negative psych ROS   GI/Hepatic negative GI ROS, Neg liver ROS,   Endo/Other  negative endocrine ROS  Renal/GU negative Renal ROS  negative genitourinary    Musculoskeletal negative musculoskeletal ROS (+)   Abdominal   Peds negative pediatric ROS (+)  Hematology negative hematology ROS (+)   Anesthesia Other Findings   Reproductive/Obstetrics negative OB ROS                             Anesthesia Physical Anesthesia Plan  ASA: IV  Anesthesia Plan: General   Post-op Pain Management:    Induction: Intravenous  PONV Risk Score and Plan: TIVA  Airway Management Planned: Nasal Cannula, Natural Airway and Simple Face Mask  Additional Equipment:   Intra-op Plan:   Post-operative Plan:   Informed Consent: I have reviewed the patients History and Physical, chart, labs and discussed the procedure including the risks, benefits and alternatives for the proposed anesthesia with the patient or authorized representative who has indicated his/her understanding and acceptance.     Dental advisory given  Plan Discussed with: CRNA and Surgeon  Anesthesia Plan Comments:         Anesthesia Quick Evaluation

## 2020-04-03 NOTE — Discharge Instructions (Addendum)
Monitored Anesthesia Care, Care After This sheet gives you information about how to care for yourself after your procedure. Your health care provider may also give you more specific instructions. If you have problems or questions, contact your health care provider. What can I expect after the procedure? After the procedure, it is common to have:  Tiredness.  Forgetfulness about what happened after the procedure.  Impaired judgment for important decisions.  Nausea or vomiting.  Some difficulty with balance. Follow these instructions at home: For the time period you were told by your health care provider:  Rest as needed.  Do not participate in activities where you could fall or become injured.  Do not drive or use machinery.  Do not drink alcohol.  Do not take sleeping pills or medicines that cause drowsiness.  Do not make important decisions or sign legal documents.  Do not take care of children on your own.      Eating and drinking  Follow the diet that is recommended by your health care provider.  Drink enough fluid to keep your urine pale yellow.  If you vomit: ? Drink water, juice, or soup when you can drink without vomiting. ? Make sure you have little or no nausea before eating solid foods. General instructions  Have a responsible adult stay with you for the time you are told. It is important to have someone help care for you until you are awake and alert.  Take over-the-counter and prescription medicines only as told by your health care provider.  If you have sleep apnea, surgery and certain medicines can increase your risk for breathing problems. Follow instructions from your health care provider about wearing your sleep device: ? Anytime you are sleeping, including during daytime naps. ? While taking prescription pain medicines, sleeping medicines, or medicines that make you drowsy.  Avoid smoking.  Keep all follow-up visits as told by your health care  provider. This is important. Contact a health care provider if:  You keep feeling nauseous or you keep vomiting.  You feel light-headed.  You are still sleepy or having trouble with balance after 24 hours.  You develop a rash.  You have a fever.  You have redness or swelling around the IV site. Get help right away if:  You have trouble breathing.  You have new-onset confusion at home. Summary  For several hours after your procedure, you may feel tired. You may also be forgetful and have poor judgment.  Have a responsible adult stay with you for the time you are told. It is important to have someone help care for you until you are awake and alert.  Rest as told. Do not drive or operate machinery. Do not drink alcohol or take sleeping pills.  Get help right away if you have trouble breathing, or if you suddenly become confused. This information is not intended to replace advice given to you by your health care provider. Make sure you discuss any questions you have with your health care provider. Document Revised: 11/15/2019 Document Reviewed: 02/01/2019 Elsevier Patient Education  2021 [place].  Hospital doctor cardioversion is the delivery of a jolt of electricity to restore a normal rhythm to the heart. A rhythm that is too fast or is not regular keeps the heart from pumping well. In this procedure, sticky patches or metal paddles are placed on the chest to deliver electricity to the heart from a device. This procedure may be done in an emergency if:  There  is low or no blood pressure as a result of the heart rhythm.  Normal rhythm must be restored as fast as possible to protect the brain and heart from further damage.  It may save a life. This may also be a scheduled procedure for irregular or fast heart rhythms that are not immediately life-threatening. Tell a health care provider about:  Any allergies you have.  All medicines you are taking,  including vitamins, herbs, eye drops, creams, and over-the-counter medicines.  Any problems you or family members have had with anesthetic medicines.  Any blood disorders you have.  Any surgeries you have had.  Any medical conditions you have.  Whether you are pregnant or may be pregnant. What are the risks? Generally, this is a safe procedure. However, problems may occur, including:  Allergic reactions to medicines.  A blood clot that breaks free and travels to other parts of your body.  The possible return of an abnormal heart rhythm within hours or days after the procedure.  Your heart stopping (cardiac arrest). This is rare. What happens before the procedure? Medicines  Your health care provider may have you start taking: ? Blood-thinning medicines (anticoagulants) so your blood does not clot as easily. ? Medicines to help stabilize your heart rate and rhythm.  Ask your health care provider about: ? Changing or stopping your regular medicines. This is especially important if you are taking diabetes medicines or blood thinners. ? Taking medicines such as aspirin and ibuprofen. These medicines can thin your blood. Do not take these medicines unless your health care provider tells you to take them. ? Taking over-the-counter medicines, vitamins, herbs, and supplements. General instructions  Follow instructions from your health care provider about eating or drinking restrictions.  Plan to have someone take you home from the hospital or clinic.  If you will be going home right after the procedure, plan to have someone with you for 24 hours.  Ask your health care provider what steps will be taken to help prevent infection. These may include washing your skin with a germ-killing soap. What happens during the procedure?  An IV will be inserted into one of your veins.  Sticky patches (electrodes) or metal paddles may be placed on your chest.  You will be given a medicine to  help you relax (sedative).  An electrical shock will be delivered. The procedure may vary among health care providers and hospitals.   What can I expect after the procedure?  Your blood pressure, heart rate, breathing rate, and blood oxygen level will be monitored until you leave the hospital or clinic.  Your heart rhythm will be watched to make sure it does not change.  You may have some redness on the skin where the shocks were given. Follow these instructions at home:  Do not drive for 24 hours if you were given a sedative during your procedure.  Take over-the-counter and prescription medicines only as told by your health care provider.  Ask your health care provider how to check your pulse. Check it often.  Rest for 48 hours after the procedure or as told by your health care provider.  Avoid or limit your caffeine use as told by your health care provider.  Keep all follow-up visits as told by your health care provider. This is important. Contact a health care provider if:  You feel like your heart is beating too quickly or your pulse is not regular.  You have a serious muscle cramp that does  not go away. Get help right away if:  You have discomfort in your chest.  You are dizzy or you feel faint.  You have trouble breathing or you are short of breath.  Your speech is slurred.  You have trouble moving an arm or leg on one side of your body.  Your fingers or toes turn cold or blue. Summary  Electrical cardioversion is the delivery of a jolt of electricity to restore a normal rhythm to the heart.  This procedure may be done right away in an emergency or may be a scheduled procedure if the condition is not an emergency.  Generally, this is a safe procedure.  After the procedure, check your pulse often as told by your health care provider. This information is not intended to replace advice given to you by your health care provider. Make sure you discuss any questions  you have with your health care provider. Document Revised: 10/02/2018 Document Reviewed: 10/02/2018 Elsevier Patient Education  [place].

## 2020-04-03 NOTE — Interval H&P Note (Signed)
History and Physical Interval Note:  04/03/2020 8:54 AM  [name]  has presented today for surgery, with the diagnosis of a-fib.  The various methods of treatment have been discussed with the patient and family. After consideration of risks, benefits and other options for treatment, the patient has consented to  Procedure(s): CARDIOVERSION (N/A) as a surgical intervention.  The patient's history has been reviewed, patient examined, no change in status, stable for surgery.  I have reviewed the patient's chart and labs.  Questions were answered to the patient's satisfaction.     Rozann Lesches

## 2020-04-03 NOTE — Progress Notes (Signed)
Electrical Cardioversion Procedure Note [name] [id number] 12-20-1955  Procedure: Electrical Cardioversion Indications:  Atrial Fibrillation Procedure Details Consent:Direct current cardioversoinTime Out:  Verified patient identification, verified procedure, site/side was marked, verified correct patient position, special equipment/implants available, medications/allergies/relevent history reviewed, required imaging and test results available.  0920 Patient placed on cardiac monitor, pulse oximetry, supplemental oxygen as necessary.  Sedation given:as per anesthesia Pacer pads placed  Anterior/posterior Cardioverted 3 time(s).  Cardioverted at  150 J, 200 J, 200J, Evaluation Findings: Post procedure EKG shows:pt remains in A fib. Complications:none Patient did tolerate procedure well.   Jon Gills Thora Scherman 04/03/2020, 9:56 AM

## 2020-04-03 NOTE — Telephone Encounter (Signed)
[name] called stating that he was suppose to have a visit with Dr. Domenic Polite next week after Cardioversion.  Dr. Domenic Polite stated that [name] would need to contact patient with instructions.

## 2020-04-03 NOTE — Telephone Encounter (Signed)
Wife, Tayo Maute informed that patient is to f/u n A-Fib Clinic per Allred's last office visit after DCCV. See McDowell's response below: Verbalized understanding of plan.   Per McDowell--Patient did not convert this morning with DCCV. Three separate shocks, was only in sinus rhythm for a few seconds after each and then back to atrial fibrillation. [name], please make sure that patient has follow-up in the AF clinic as mentioned in Dr. Jackalyn Lombard last note. I did not change his Multaq or other medications, can be considered for either a different antiarrhythmic regimen or possibly candidacy for ablation.

## 2020-04-03 NOTE — CV Procedure (Signed)
Elective direct-current cardioversion  Indication: Persistent atrial fibrillation  Description of procedure: After informed consent was obtained the patient was taken to the procedure suite where a timeout was performed.  Anterior and posterior pads were placed in standard fashion and connected to a biphasic defibrillator.  Deep sedation was achieved with use of propofol per the Anesthesia service, please refer to their records for dose administration and monitoring information.  With sandbag on anterior chest pad, an initial synchronized 150 J shock was delivered with resulting persistent atrial fibrillation.  A second synchronized 200 J shock was then delivered with sinus rhythm documented for only a few seconds and then return to atrial fibrillation.  Finally, a third synchronized 200 J shock was delivered, again with sinus rhythm present for about 5 seconds and then return to atrial fibrillation.  Patient remained hemodynamically stable throughout and there were no immediate complications.  Disposition: Situation discussed with the patient's wife by phone and also the patient after back to baseline.  Information conveyed to Dr. Rayann Heman as well.  Next step will be follow-up in the atrial fibrillation clinic for discussion of modification in antiarrhythmic therapy versus potential ablation.  For now he will continue his present regimen.  [name], M.D., F.A.C.C.

## 2020-04-07 ENCOUNTER — Ambulatory Visit (HOSPITAL_COMMUNITY)
Admit: 2020-04-07 | Discharge: 2020-04-07 | Disposition: A | Discharge status: Home / Self Care | Payer: Medicare Other | Source: Ambulatory Visit | Attending: Physician Assistant | Admitting: Physician Assistant

## 2020-04-07 ENCOUNTER — Encounter (HOSPITAL_COMMUNITY): Payer: Self-pay | Admitting: Physician Assistant

## 2020-04-07 ENCOUNTER — Other Ambulatory Visit: Payer: Self-pay

## 2020-04-07 VITALS — BP 118/88 | HR 91 | Ht 70.0 in | Wt 263.6 lb

## 2020-04-07 DIAGNOSIS — Z6837 Body mass index (BMI) 37.0-37.9, adult: Secondary | ICD-10-CM | POA: Insufficient documentation

## 2020-04-07 DIAGNOSIS — D6869 Other thrombophilia: Secondary | ICD-10-CM | POA: Diagnosis not present

## 2020-04-07 DIAGNOSIS — I2510 Atherosclerotic heart disease of native coronary artery without angina pectoris: Secondary | ICD-10-CM | POA: Diagnosis not present

## 2020-04-07 DIAGNOSIS — Z87891 Personal history of nicotine dependence: Secondary | ICD-10-CM | POA: Insufficient documentation

## 2020-04-07 DIAGNOSIS — Z7901 Long term (current) use of anticoagulants: Secondary | ICD-10-CM | POA: Insufficient documentation

## 2020-04-07 DIAGNOSIS — Z79899 Other long term (current) drug therapy: Secondary | ICD-10-CM | POA: Diagnosis not present

## 2020-04-07 DIAGNOSIS — I252 Old myocardial infarction: Secondary | ICD-10-CM | POA: Insufficient documentation

## 2020-04-07 DIAGNOSIS — I4819 Other persistent atrial fibrillation: Secondary | ICD-10-CM

## 2020-04-07 DIAGNOSIS — E669 Obesity, unspecified: Secondary | ICD-10-CM | POA: Diagnosis not present

## 2020-04-07 DIAGNOSIS — Z8249 Family history of ischemic heart disease and other diseases of the circulatory system: Secondary | ICD-10-CM | POA: Insufficient documentation

## 2020-04-07 DIAGNOSIS — I10 Essential (primary) hypertension: Secondary | ICD-10-CM | POA: Insufficient documentation

## 2020-04-07 MED ORDER — FUROSEMIDE 20 MG PO TABS: ORAL_TABLET | ORAL | 1 refills | Status: DC

## 2020-04-07 NOTE — Patient Instructions (Signed)
Lasix 20mg  once a day for the next 3 days then only as needed for weight gain/swelling  Stop multaq

## 2020-04-07 NOTE — Progress Notes (Signed)
Primary Care Physician: Rory Percy, MD Primary Cardiologist: Dr Domenic Polite Primary Electrophysiologist: Dr Rayann Heman Referring Physician: Dr Solmon Ice is a 65 y.o. male with a history of CAD, HLD, HTN, and atrial fibrillation who presents for follow up in the [facility]. Patient has been maintained on Multaq. Patient is on Xarelto for a CHADS2VASC score of 3. He was found to be in persistent afib and underwent DCCV on 04/03/20 which was unsuccessful. Patient continues to have symptoms of SOB and abdominal swelling. He denies any bleeding issues on anticoagulation.   Today, he denies symptoms of palpitations, chest pain, orthopnea, PND, dizziness, presyncope, syncope, snoring, daytime somnolence, bleeding, or neurologic sequela. The patient is tolerating medications without difficulties and is otherwise without complaint today.    Atrial Fibrillation Risk Factors:  he does not have symptoms or diagnosis of sleep apnea. Negative sleep study. he does not have a history of rheumatic fever.   he has a BMI of Body mass index is 37.82 kg/m.[name] Filed Weights   04/07/20 1417  Weight: 119.6 kg    Family History  Problem Relation Age of Onset  . Coronary artery disease Other   . Cancer Mother   . Diabetes Mother   . Heart disease Mother   . COPD Father   . High blood pressure Father   . Heart disease Father      Atrial Fibrillation Management history:  Previous antiarrhythmic drugs: Multaq Previous cardioversions: 04/03/20 Previous ablations: none CHADS2VASC score: 3 Anticoagulation history: Xarelto   Past Medical History:  Diagnosis Date  . Asthma   . COPD (chronic obstructive pulmonary disease) ([place])   . Coronary atherosclerosis of native coronary artery    a. s/p DES to RCA in 2006 b. DES to LCx in 2012 with CTO of RCA noted at that time  . Hypertension   . Mixed hyperlipidemia   . Myocardial infarction ([place])    NSTEMI 4/12   . Paroxysmal atrial fibrillation ([facility])   . Seasonal allergies   . Skin cancer    Past Surgical History:  Procedure Laterality Date  . CARDIAC CATHETERIZATION N/A 07/26/2014   Procedure: Left Heart Cath and Coronary Angiography;  Surgeon: Peter M Martinique, MD;  Location: [facility];  Service: Cardiovascular;  Laterality: N/A;  . CORONARY ANGIOPLASTY     stents   . CYSTOSCOPY  01/31/2013  . Nasal reconstruction surgery      Current Outpatient Medications  Medication Sig Dispense Refill  . acetaminophen (TYLENOL) 500 MG tablet Take 1,000 mg by mouth every 4 (four) hours as needed for moderate pain.     [name] albuterol (PROVENTIL) (2.5 MG/3ML) 0.083% nebulizer solution Take 2.5 mg by nebulization every 6 (six) hours as needed for wheezing or shortness of breath.    [name] atorvastatin (LIPITOR) 80 MG tablet TAKE 1 TABLET(80 MG) BY MOUTH DAILY 30 tablet 6  . dronedarone (MULTAQ) 400 MG tablet TAKE 1 BY MOUTH TWICE DAILY( EVERY 12 HOURS) WITH A MEAL 180 tablet 2  . guaiFENesin (MUCINEX) 600 MG 12 hr tablet Take 600 mg by mouth 2 (two) times daily.     [name] loratadine (CLARITIN) 10 MG tablet Take 10 mg by mouth daily.    . metoprolol tartrate (LOPRESSOR) 25 MG tablet Take 1 tablet (25 mg total) by mouth 2 (two) times daily. 180 tablet 1  . Multiple Vitamin (MULTIVITAMIN) tablet Take 1 tablet by mouth daily.    . nitroGLYCERIN (NITROSTAT) 0.4  MG SL tablet place 1 tablet under the tongue every 5 minutes for UP TO 3 doses if needed for angina as directed by prescriber 25 tablet 3  . Omega-3 Fatty Acids (FISH OIL) 1000 MG CAPS Take 1,000 mg by mouth 2 (two) times daily.    Alveda Reasons 20 MG TABS tablet TAKE 1 TABLET BY MOUTH DAILY WITH SUPPER 30 tablet 11   No current facility-administered medications for this encounter.    No Known Allergies  Social History   Socioeconomic History  . Marital status: Married    Spouse name: Not on file  . Number of children: Not on file  . Years of education:  Not on file  . Highest education level: Not on file  Occupational History  . Occupation: Agricultural consultant  Tobacco Use  . Smoking status: Former Smoker    Packs/day: 1.00    Years: 30.00    Pack years: 30.00    Types: Cigarettes    Start date: 03/12/1970    Quit date: 08/13/2017    Years since quitting: 2.6  . Smokeless tobacco: Never Used  Vaping Use  . Vaping Use: Never used  Substance and Sexual Activity  . Alcohol use: No    Alcohol/week: 0.0 standard drinks  . Drug use: No  . Sexual activity: Yes  Other Topics Concern  . Not on file  Social History Narrative   Lives in [place] with spouse.   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  Physical Exam: Vitals:   04/07/20 1417  BP: 118/88  Pulse: 91  Weight: 119.6 kg  Height: 5\' 10"  (1.778 m)    GEN- The patient is well appearing obese male, alert and oriented x 3 today.   Head- normocephalic, atraumatic Eyes-  Sclera clear, conjunctiva pink Ears- hearing intact Oropharynx- clear Neck- supple  Lungs- Clear to ausculation bilaterally, normal work of breathing Heart- irregular rate and rhythm, no murmurs, rubs or gallops  GI- soft, NT, ND, + BS Extremities- no clubbing, cyanosis, or edema MS- no significant deformity or atrophy Skin- no rash or lesion Psych- euthymic mood, full affect Neuro- strength and sensation are intact  Wt Readings from Last 3 Encounters:  04/07/20 119.6 kg  04/03/20 118.3 kg  04/01/20 118.3 kg    EKG today demonstrates  Afib  Vent. rate 91 BPM QRS duration 86 ms QT/QTc 366/450 ms   Echo 07/06/17 demonstrated  - Left ventricle: The cavity size was mildly  dilated. Wall  thickness was increased in a pattern of mild LVH. Systolic  function was normal. The estimated ejection fraction was in the  range of 60% to 65%. Possible hypokinesis of the basalinferior  myocardium. Left ventricular diastolic function parameters were  normal for the patient&'s age.  - Aortic valve: Mildly calcified annulus. Trileaflet. There was  mild regurgitation.  - Mitral valve: There was trivial regurgitation.  - Right atrium: Central venous pressure (est): 3 mm Hg.  - Atrial septum: No defect or patent foramen ovale was identified.  - Tricuspid valve: There was trivial regurgitation.  - Pulmonary arteries: Systolic pressure could not be accurately  estimated.  -  Pericardium, extracardiac: There was no pericardial effusion.   Epic records are reviewed at length today  CHA2DS2-VASc Score = 3  The patient's score is based upon: CHF History: No HTN History: Yes Diabetes History: No Stroke History: No Vascular Disease History: Yes Age Score: 1 Gender Score: 0      ASSESSMENT AND PLAN: 1. Persistent Atrial Fibrillation (ICD10:  I48.19) The patient's CHA2DS2-VASc score is 3, indicating a 3.2% annual risk of stroke.   S/p unsuccessful DCCV 04/03/20 We discussed therapeutic options today, specifically ablation. he has failed medical therapy with Multaq. Therapeutic strategies for afib including medicine (dofetilide) and ablation were discussed in detail with the patient today. Risk, benefits, and alternatives to EP study and radiofrequency ablation for afib were also discussed in detail today. These risks include but are not limited to stroke, bleeding, vascular damage, tamponade, perforation, damage to the esophagus, lungs, and other structures, pulmonary vein stenosis, worsening renal function, and death. The patient understands these risk and wishes to proceed. Will refer back to Dr Rayann Heman for consideration.  Check echocardiogram Will stop Multaq as  he has been persistently in afib.  Continue Lopressor 25 mg BID Continue Xarelto 20 mg daily Start Lasix 20 mg daily x 3 days, then continue PRN.  2. Secondary Hypercoagulable State (ICD10:  D68.69) The patient is at significant risk for stroke/thromboembolism based upon his CHA2DS2-VASc Score of 3.  Continue Rivaroxaban (Xarelto).   3. Obesity Body mass index is 37.82 kg/m. Lifestyle modification was discussed at length including regular exercise and weight reduction.  4. HTN Stable, no changes today.  5. CAD No anginal symptoms.   Follow up with Dr Rayann Heman.    [facility] [address] [place], [place] [address] [phone] 04/07/2020 2:26 PM

## 2020-04-08 ENCOUNTER — Ambulatory Visit: Payer: Medicare Other | Admitting: Cardiology

## 2020-04-09 ENCOUNTER — Other Ambulatory Visit: Payer: Self-pay

## 2020-04-09 ENCOUNTER — Ambulatory Visit (HOSPITAL_COMMUNITY)
Admission: RE | Admit: 2020-04-09 | Discharge: 2020-04-09 | Disposition: A | Discharge status: Home / Self Care | Payer: Medicare Other | Source: Ambulatory Visit | Attending: Physician Assistant | Admitting: Physician Assistant

## 2020-04-09 DIAGNOSIS — I351 Nonrheumatic aortic (valve) insufficiency: Secondary | ICD-10-CM

## 2020-04-09 DIAGNOSIS — I4819 Other persistent atrial fibrillation: Secondary | ICD-10-CM | POA: Insufficient documentation

## 2020-04-09 LAB — ECHOCARDIOGRAM COMPLETE
AR max vel: 2.48 cm2
AV Area VTI: 2.15 cm2
AV Area mean vel: 2.21 cm2
AV Mean grad: 3.6 mmHg
AV Peak grad: 6.7 mmHg
Ao pk vel: 1.3 m/s
Area-P 1/2: 3.43 cm2
P 1/2 time: 573 msec
S' Lateral: 3.29 cm

## 2020-04-09 NOTE — Progress Notes (Signed)
*  PRELIMINARY RESULTS* Echocardiogram 2D Echocardiogram has been performed.  Leavy Cella 04/09/2020, 11:18 AM

## 2020-04-14 ENCOUNTER — Encounter: Payer: Self-pay | Admitting: *Deleted

## 2020-04-14 ENCOUNTER — Telehealth: Payer: Self-pay | Admitting: *Deleted

## 2020-04-14 DIAGNOSIS — I4891 Unspecified atrial fibrillation: Secondary | ICD-10-CM

## 2020-04-14 NOTE — Telephone Encounter (Signed)
Patient wishes to go forward with ablation after failed DCCV. Expressed wishes at follow up with Atrial Fibrillation clinic. Per MD Allred okay to schedule.  Will meet with instructions for ablation and CT on scheduled lab day at the [address] office. Patient aware

## 2020-04-17 ENCOUNTER — Other Ambulatory Visit: Payer: Medicare Other

## 2020-04-17 ENCOUNTER — Other Ambulatory Visit: Payer: Self-pay

## 2020-04-17 ENCOUNTER — Telehealth: Payer: Self-pay | Admitting: Internal Medicine

## 2020-04-17 DIAGNOSIS — I4891 Unspecified atrial fibrillation: Secondary | ICD-10-CM

## 2020-04-17 LAB — BASIC METABOLIC PANEL
BUN/Creatinine Ratio: 16 (ref 10–24)
BUN: 16 mg/dL (ref 8–27)
CO2: 26 mmol/L (ref 20–29)
Calcium: 8.9 mg/dL (ref 8.6–10.2)
Chloride: 103 mmol/L (ref 96–106)
Creatinine, Ser: 1.02 mg/dL (ref 0.76–1.27)
GFR calc Af Amer: 88 mL/min/{1.73_m2} (ref >59)
GFR calc non Af Amer: 76 mL/min/{1.73_m2} (ref >59)
Glucose: 135 mg/dL — ABNORMAL HIGH (ref 65–99)
Potassium: 4.3 mmol/L (ref 3.5–5.2)
Sodium: 140 mmol/L (ref 134–144)

## 2020-04-17 LAB — CBC WITH DIFFERENTIAL/PLATELET
Basophils Absolute: 0.1 10*3/uL (ref 0.0–0.2)
Basos: 0 %
EOS (ABSOLUTE): 0.3 10*3/uL (ref 0.0–0.4)
Eos: 2 %
Hematocrit: 52.5 % — ABNORMAL HIGH (ref 37.5–51.0)
Hemoglobin: 17.6 g/dL (ref 13.0–17.7)
Immature Grans (Abs): 0.1 10*3/uL (ref 0.0–0.1)
Immature Granulocytes: 1 %
Lymphocytes Absolute: 3.1 10*3/uL (ref 0.7–3.1)
Lymphs: 23 %
MCH: 31.4 pg (ref 26.6–33.0)
MCHC: 33.5 g/dL (ref 31.5–35.7)
MCV: 94 fL (ref 79–97)
Monocytes Absolute: 0.9 10*3/uL (ref 0.1–0.9)
Monocytes: 7 %
Neutrophils Absolute: 8.9 10*3/uL — ABNORMAL HIGH (ref 1.4–7.0)
Neutrophils: 67 %
Platelets: 358 10*3/uL (ref 150–450)
RBC: 5.6 x10E6/uL (ref 4.14–5.80)
RDW: 12.6 % (ref 11.6–15.4)
WBC: 13.2 10*3/uL — ABNORMAL HIGH (ref 3.4–10.8)

## 2020-04-17 NOTE — Telephone Encounter (Signed)
Pt is wanting to have his Covid test for his Ablation with Dr. Rayann Heman done at [facility] instead of having to go to [facility]-   It's to much traveling for the pt since he's having to have testing and other things done before the actual procedure.   States he will not have the Ablation done if he has to be Covid tested in [place].   Please call pt's wife Novella [phone]

## 2020-04-18 NOTE — Telephone Encounter (Signed)
Wife advised that [facility] does not do covid test for procedures being done at [facility]. Verbalized understanding.

## 2020-04-24 ENCOUNTER — Ambulatory Visit: Payer: Medicare Other | Admitting: Cardiology

## 2020-04-28 ENCOUNTER — Telehealth (HOSPITAL_COMMUNITY): Payer: Self-pay | Admitting: *Deleted

## 2020-04-28 NOTE — Telephone Encounter (Signed)
Pt's wife returning call regarding upcoming cardiac imaging study; pt's wife verbalizes understanding of appt date/time, parking situation and where to check in, pre-test NPO status and medications ordered, and verified current allergies; name and call back number provided for further questions should they arise  Gordy Clement RN Navigator Cardiac Imaging [facility] [phone] office [phone] cell

## 2020-04-28 NOTE — Telephone Encounter (Signed)
Attempted to call patient regarding upcoming cardiac CT appointment. °Left message on voicemail with name and callback number ° °Johnay Mano RN Navigator Cardiac Imaging °[facility] °[phone] Office °[phone] Cell ° °

## 2020-04-30 ENCOUNTER — Ambulatory Visit (HOSPITAL_COMMUNITY)
Admission: RE | Admit: 2020-04-30 | Discharge: 2020-04-30 | Disposition: A | Discharge status: Home / Self Care | Payer: Medicare Other | Source: Ambulatory Visit | Attending: Internal Medicine | Admitting: Internal Medicine

## 2020-04-30 ENCOUNTER — Other Ambulatory Visit: Payer: Self-pay

## 2020-04-30 ENCOUNTER — Encounter (HOSPITAL_COMMUNITY): Payer: Self-pay

## 2020-04-30 DIAGNOSIS — I4891 Unspecified atrial fibrillation: Secondary | ICD-10-CM | POA: Insufficient documentation

## 2020-04-30 MED ORDER — IOHEXOL 350 MG/ML SOLN
100.0000 mL | Freq: Once | INTRAVENOUS | Status: AC | PRN
  Administered 2020-04-30: 100 mL via INTRAVENOUS

## 2020-04-30 MED ORDER — METOPROLOL TARTRATE 5 MG/5ML IV SOLN
INTRAVENOUS | Status: AC
  Administered 2020-04-30: 5 mg via INTRAVENOUS
  Filled 2020-04-30: qty 5

## 2020-04-30 MED ORDER — METOPROLOL TARTRATE 5 MG/5ML IV SOLN
INTRAVENOUS | Status: AC
  Filled 2020-04-30: qty 5

## 2020-04-30 MED ORDER — METOPROLOL TARTRATE 5 MG/5ML IV SOLN
5.0000 mg | INTRAVENOUS | Status: DC | PRN
  Administered 2020-04-30: 5 mg via INTRAVENOUS

## 2020-04-30 MED ORDER — DILTIAZEM HCL 25 MG/5ML IV SOLN
INTRAVENOUS | Status: AC
  Filled 2020-04-30: qty 5

## 2020-05-05 ENCOUNTER — Ambulatory Visit (HOSPITAL_COMMUNITY): Payer: Medicare Other | Admitting: Physician Assistant

## 2020-05-05 ENCOUNTER — Other Ambulatory Visit (HOSPITAL_COMMUNITY)
Admission: RE | Admit: 2020-05-05 | Discharge: 2020-05-05 | Disposition: A | Discharge status: Home / Self Care | Payer: Medicare Other | Source: Ambulatory Visit | Attending: Internal Medicine | Admitting: Internal Medicine

## 2020-05-05 DIAGNOSIS — Z01812 Encounter for preprocedural laboratory examination: Secondary | ICD-10-CM | POA: Insufficient documentation

## 2020-05-05 DIAGNOSIS — Z20822 Contact with and (suspected) exposure to covid-19: Secondary | ICD-10-CM | POA: Insufficient documentation

## 2020-05-05 LAB — SARS CORONAVIRUS 2 (TAT 6-24 HRS): SARS Coronavirus 2: NEGATIVE

## 2020-05-06 ENCOUNTER — Encounter (HOSPITAL_COMMUNITY): Payer: Self-pay | Admitting: Internal Medicine

## 2020-05-06 NOTE — Progress Notes (Signed)
Instructed patient on the following items: Arrival time 0830 Nothing to eat or drink after midnight No meds AM of procedure Responsible person to drive you home and stay with you for 24 hrs  Have you missed any doses of anti-coagulant- Xarelto    

## 2020-05-07 ENCOUNTER — Encounter (HOSPITAL_COMMUNITY)
Admission: RE | Disposition: A | Discharge status: Home / Self Care | Payer: Medicare Other | Source: Home / Self Care | Attending: Internal Medicine

## 2020-05-07 ENCOUNTER — Ambulatory Visit (HOSPITAL_COMMUNITY): Payer: Medicare Other | Admitting: Anesthesiology

## 2020-05-07 ENCOUNTER — Encounter (HOSPITAL_COMMUNITY): Payer: Self-pay | Admitting: Internal Medicine

## 2020-05-07 ENCOUNTER — Ambulatory Visit (HOSPITAL_COMMUNITY)
Admission: RE | Admit: 2020-05-07 | Discharge: 2020-05-07 | Disposition: A | Discharge status: Home / Self Care | Payer: Medicare Other | Attending: Internal Medicine | Admitting: Internal Medicine

## 2020-05-07 DIAGNOSIS — I10 Essential (primary) hypertension: Secondary | ICD-10-CM | POA: Diagnosis not present

## 2020-05-07 DIAGNOSIS — Z7901 Long term (current) use of anticoagulants: Secondary | ICD-10-CM | POA: Diagnosis not present

## 2020-05-07 DIAGNOSIS — E782 Mixed hyperlipidemia: Secondary | ICD-10-CM | POA: Diagnosis not present

## 2020-05-07 DIAGNOSIS — I4819 Other persistent atrial fibrillation: Secondary | ICD-10-CM | POA: Insufficient documentation

## 2020-05-07 DIAGNOSIS — Z79899 Other long term (current) drug therapy: Secondary | ICD-10-CM | POA: Diagnosis not present

## 2020-05-07 DIAGNOSIS — I252 Old myocardial infarction: Secondary | ICD-10-CM | POA: Diagnosis not present

## 2020-05-07 DIAGNOSIS — Z85828 Personal history of other malignant neoplasm of skin: Secondary | ICD-10-CM | POA: Insufficient documentation

## 2020-05-07 DIAGNOSIS — Z955 Presence of coronary angioplasty implant and graft: Secondary | ICD-10-CM | POA: Diagnosis not present

## 2020-05-07 DIAGNOSIS — Z7951 Long term (current) use of inhaled steroids: Secondary | ICD-10-CM | POA: Diagnosis not present

## 2020-05-07 DIAGNOSIS — I2510 Atherosclerotic heart disease of native coronary artery without angina pectoris: Secondary | ICD-10-CM | POA: Insufficient documentation

## 2020-05-07 HISTORY — PX: ATRIAL FIBRILLATION ABLATION: EP1191

## 2020-05-07 LAB — POCT ACTIVATED CLOTTING TIME
Activated Clotting Time: 309 seconds
Activated Clotting Time: 309 seconds

## 2020-05-07 SURGERY — ATRIAL FIBRILLATION ABLATION
Anesthesia: General

## 2020-05-07 MED ORDER — PHENYLEPHRINE HCL-NACL 10-0.9 MG/250ML-% IV SOLN
INTRAVENOUS | Status: DC | PRN
  Administered 2020-05-07: 25 ug/min via INTRAVENOUS

## 2020-05-07 MED ORDER — RIVAROXABAN 20 MG PO TABS
20.0000 mg | ORAL_TABLET | ORAL | Status: AC
  Administered 2020-05-07: 20 mg via ORAL
  Filled 2020-05-07: qty 1

## 2020-05-07 MED ORDER — SODIUM CHLORIDE 0.9% FLUSH: 3.0000 mL | INTRAVENOUS | Status: DC | PRN

## 2020-05-07 MED ORDER — HEPARIN SODIUM (PORCINE) 1000 UNIT/ML IJ SOLN
INTRAMUSCULAR | Status: AC
  Filled 2020-05-07: qty 2

## 2020-05-07 MED ORDER — HEPARIN SODIUM (PORCINE) 1000 UNIT/ML IJ SOLN
INTRAMUSCULAR | Status: DC | PRN
  Administered 2020-05-07: 15000 [IU] via INTRAVENOUS
  Administered 2020-05-07: 1000 [IU] via INTRAVENOUS

## 2020-05-07 MED ORDER — ROCURONIUM BROMIDE 10 MG/ML (PF) SYRINGE
PREFILLED_SYRINGE | INTRAVENOUS | Status: DC | PRN
  Administered 2020-05-07: 20 mg via INTRAVENOUS
  Administered 2020-05-07: 10 mg via INTRAVENOUS
  Administered 2020-05-07: 70 mg via INTRAVENOUS

## 2020-05-07 MED ORDER — SUGAMMADEX SODIUM 200 MG/2ML IV SOLN
INTRAVENOUS | Status: DC | PRN
  Administered 2020-05-07: 200 mg via INTRAVENOUS

## 2020-05-07 MED ORDER — SODIUM CHLORIDE 0.9% FLUSH: 3.0000 mL | Freq: Two times a day (BID) | INTRAVENOUS | Status: DC

## 2020-05-07 MED ORDER — HEPARIN (PORCINE) IN NACL 1000-0.9 UT/500ML-% IV SOLN
INTRAVENOUS | Status: DC | PRN
  Administered 2020-05-07: 500 mL

## 2020-05-07 MED ORDER — PANTOPRAZOLE SODIUM 40 MG PO TBEC: 40.0000 mg | DELAYED_RELEASE_TABLET | Freq: Every day | ORAL | 0 refills | Status: DC

## 2020-05-07 MED ORDER — FENTANYL CITRATE (PF) 100 MCG/2ML IJ SOLN
INTRAMUSCULAR | Status: DC | PRN
  Administered 2020-05-07: 100 ug via INTRAVENOUS

## 2020-05-07 MED ORDER — ACETAMINOPHEN 325 MG PO TABS: 650.0000 mg | ORAL_TABLET | ORAL | Status: DC | PRN

## 2020-05-07 MED ORDER — HEPARIN (PORCINE) IN NACL 1000-0.9 UT/500ML-% IV SOLN
INTRAVENOUS | Status: AC
  Filled 2020-05-07: qty 500

## 2020-05-07 MED ORDER — DEXAMETHASONE SODIUM PHOSPHATE 4 MG/ML IJ SOLN
INTRAMUSCULAR | Status: DC | PRN
  Administered 2020-05-07: 4 mg via INTRAVENOUS

## 2020-05-07 MED ORDER — SODIUM CHLORIDE 0.9 % IV SOLN: 250.0000 mL | INTRAVENOUS | Status: DC | PRN

## 2020-05-07 MED ORDER — LIDOCAINE 2% (20 MG/ML) 5 ML SYRINGE
INTRAMUSCULAR | Status: DC | PRN
  Administered 2020-05-07: 30 mg via INTRAVENOUS

## 2020-05-07 MED ORDER — ONDANSETRON HCL 4 MG/2ML IJ SOLN
INTRAMUSCULAR | Status: DC | PRN
  Administered 2020-05-07: 4 mg via INTRAVENOUS

## 2020-05-07 MED ORDER — PROTAMINE SULFATE 10 MG/ML IV SOLN
INTRAVENOUS | Status: DC | PRN
  Administered 2020-05-07: 20 mg via INTRAVENOUS
  Administered 2020-05-07 (×2): 10 mg via INTRAVENOUS

## 2020-05-07 MED ORDER — ONDANSETRON HCL 4 MG/2ML IJ SOLN: 4.0000 mg | Freq: Four times a day (QID) | INTRAMUSCULAR | Status: DC | PRN

## 2020-05-07 MED ORDER — HEPARIN SODIUM (PORCINE) 1000 UNIT/ML IJ SOLN
INTRAMUSCULAR | Status: DC | PRN
  Administered 2020-05-07: 3000 [IU] via INTRAVENOUS

## 2020-05-07 MED ORDER — SODIUM CHLORIDE 0.9 % IV SOLN: INTRAVENOUS | Status: DC

## 2020-05-07 MED ORDER — HYDROCODONE-ACETAMINOPHEN 5-325 MG PO TABS: 1.0000 | ORAL_TABLET | ORAL | Status: DC | PRN

## 2020-05-07 MED ORDER — ALBUTEROL SULFATE HFA 108 (90 BASE) MCG/ACT IN AERS
INHALATION_SPRAY | RESPIRATORY_TRACT | Status: DC | PRN
  Administered 2020-05-07: 5 via RESPIRATORY_TRACT

## 2020-05-07 MED ORDER — PROPOFOL 10 MG/ML IV BOLUS
INTRAVENOUS | Status: DC | PRN
  Administered 2020-05-07: 160 mg via INTRAVENOUS

## 2020-05-07 SURGICAL SUPPLY — 19 items
BLANKET WARM UNDERBOD FULL ACC (MISCELLANEOUS) ×2 IMPLANT
CATH MAPPNG PENTARAY F 2-6-2MM (CATHETERS) ×1 IMPLANT
CATH SMTCH THERMOCOOL SF DF (CATHETERS) ×2 IMPLANT
CATH SOUNDSTAR ECO 8FR (CATHETERS) ×2 IMPLANT
CATH WEBSTER BI DIR CS D-F CRV (CATHETERS) ×2 IMPLANT
CLOSURE PERCLOSE PROSTYLE (VASCULAR PRODUCTS) ×6 IMPLANT
COVER SWIFTLINK CONNECTOR (BAG) ×2 IMPLANT
MAT PREVALON FULL STRYKER (MISCELLANEOUS) ×2 IMPLANT
NEEDLE BAYLIS TRANSSEPTAL 71CM (NEEDLE) ×2 IMPLANT
PACK EP LATEX FREE (CUSTOM PROCEDURE TRAY) ×2
PACK EP LF (CUSTOM PROCEDURE TRAY) ×1 IMPLANT
PAD PRO RADIOLUCENT 2001M-C (PAD) ×2 IMPLANT
PATCH CARTO3 (PAD) ×2 IMPLANT
PENTARAY F 2-6-2MM (CATHETERS) ×2
SHEATH PINNACLE 7F 10CM (SHEATH) ×4 IMPLANT
SHEATH PINNACLE 9F 10CM (SHEATH) ×2 IMPLANT
SHEATH PROBE COVER 6X72 (BAG) ×2 IMPLANT
SHEATH SWARTZ TS SL2 63CM 8.5F (SHEATH) ×2 IMPLANT
TUBING SMART ABLATE COOLFLOW (TUBING) ×4 IMPLANT

## 2020-05-07 NOTE — Discharge Instructions (Signed)
Cardiac Ablation, Care After  This sheet gives you information about how to care for yourself after your procedure. Your health care provider may also give you more specific instructions. If you have problems or questions, contact your health care provider. What can I expect after the procedure? After the procedure, it is common to have:  Bruising around your puncture site.  Tenderness around your puncture site.  Skipped heartbeats.  Tiredness (fatigue).  Follow these instructions at home: Puncture site care   Follow instructions from your health care provider about how to take care of your puncture site. Make sure you: ? If present, leave stitches (sutures), skin glue, or adhesive strips in place. These skin closures may need to stay in place for up to 2 weeks. If adhesive strip edges start to loosen and curl up, you may trim the loose edges. Do not remove adhesive strips completely unless your health care provider tells you to do that. ? If a square bandage is present, this may be removed in 24 hours.   Check your puncture site every day for signs of infection. Check for: ? Redness, swelling, or pain. ? Fluid or blood. If your puncture site starts to bleed, lie down on your back, apply firm pressure to the area, and contact your health care provider. ? Warmth. ? Pus or a bad smell. Driving  Do not drive for at least 4 days after your procedure or however long your health care provider recommends. (Do not resume driving if you have previously been instructed not to drive for other health reasons.)  Do not drive or use heavy machinery while taking prescription pain medicine. Activity  Avoid activities that take a lot of effort for at least 7 days after your procedure.  Do not lift anything that is heavier than 5 lb (4.5 kg) for one week.   No sexual activity for 1 week.   Return to your normal activities as told by your health care provider. Ask your health care provider what  activities are safe for you. General instructions  Take over-the-counter and prescription medicines only as told by your health care provider.  Do not use any products that contain nicotine or tobacco, such as cigarettes and e-cigarettes. If you need help quitting, ask your health care provider.  You may shower after 24 hours, but Do not take baths, swim, or use a hot tub for 1 week.   Do not drink alcohol for 24 hours after your procedure.  Keep all follow-up visits as told by your health care provider. This is important. Contact a health care provider if:  You have redness, mild swelling, or pain around your puncture site.  You have fluid or blood coming from your puncture site that stops after applying firm pressure to the area.  Your puncture site feels warm to the touch.  You have pus or a bad smell coming from your puncture site.  You have a fever.  You have chest pain or discomfort that spreads to your neck, jaw, or arm.  You are sweating a lot.  You feel nauseous.  You have a fast or irregular heartbeat.  You have shortness of breath.  You are dizzy or light-headed and feel the need to lie down.  You have pain or numbness in the arm or leg closest to your puncture site. Get help right away if:  Your puncture site suddenly swells.  Your puncture site is bleeding and the bleeding does not stop after applying firm   pressure to the area. These symptoms may represent a serious problem that is an emergency. Do not wait to see if the symptoms will go away. Get medical help right away. Call your local emergency services (911 in the [place] Do not drive yourself to the hospital. Summary  After the procedure, it is normal to have bruising and tenderness at the puncture site in your groin, neck, or forearm.  Check your puncture site every day for signs of infection.  Get help right away if your puncture site is bleeding and the bleeding does not stop after applying firm  pressure to the area. This is a medical emergency. This information is not intended to replace advice given to you by your health care provider. Make sure you discuss any questions you have with your health care provider.    

## 2020-05-07 NOTE — Progress Notes (Signed)
Dr Rayann Heman at bedside, aware pt is wheezing, but improved, states he will be back around to see pt, no new orders at this time, safety maintained

## 2020-05-07 NOTE — Anesthesia Procedure Notes (Signed)
Procedure Name: Intubation Date/Time: 05/07/2020 11:52 AM Performed by: Jenne Campus, CRNA Pre-anesthesia Checklist: Patient identified, Emergency Drugs available, Suction available and Patient being monitored Patient Re-evaluated:Patient Re-evaluated prior to induction Oxygen Delivery Method: Circle System Utilized Preoxygenation: Pre-oxygenation with 100% oxygen Induction Type: IV induction Ventilation: Mask ventilation without difficulty Laryngoscope Size: Miller and 3 Grade View: Grade II Tube type: Oral Number of attempts: 1 Airway Equipment and Method: Stylet and Oral airway Placement Confirmation: ETT inserted through vocal cords under direct vision,  positive ETCO2 and breath sounds checked- equal and bilateral Secured at: 23 cm Tube secured with: Tape Dental Injury: Teeth and Oropharynx as per pre-operative assessment

## 2020-05-07 NOTE — H&P (Signed)
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  Since last being seen in our clinic, the patient reports doing reasonably well. He has been persistently in afib for quite some time.  + fatigue, SOB and decreased exercise tolerance.  Today, he denies symptoms of palpitations, chest pain, lower extremity edema, dizziness, presyncope, or syncope.  The patient is otherwise without complaint today.       Past Medical History:  Diagnosis Date  . Asthma   . COPD (chronic obstructive pulmonary disease) ([place])   . Coronary atherosclerosis of native coronary artery    a. s/p DES to RCA in 2006 b. DES to LCx in 2012 with CTO of RCA noted at that time  . Hypertension   . Mixed hyperlipidemia   . Myocardial infarction ([place])    NSTEMI 4/12  . Paroxysmal atrial fibrillation ([facility])   . Seasonal allergies   . Skin cancer         Past Surgical History:  Procedure Laterality Date  . CARDIAC CATHETERIZATION N/A 07/26/2014   Procedure: Left Heart Cath and Coronary Angiography;  Surgeon: Peter M Martinique, MD;  Location: [facility];  Service: Cardiovascular;  Laterality: N/A;  . CYSTOSCOPY  01/31/2013  . Nasal reconstruction surgery      ROS- all systems are reviewed and negatives except as per HPI above        Current Outpatient Medications  Medication Sig Dispense Refill  . acetaminophen (TYLENOL) 500 MG tablet Take 1,000 mg by mouth every 4 (four) hours as needed for moderate pain.     [name] albuterol (PROVENTIL) (2.5 MG/3ML) 0.083% nebulizer solution Take 2.5 mg by nebulization as needed for wheezing or shortness of breath.    [name] atorvastatin (LIPITOR) 80 MG tablet TAKE 1 TABLET(80 MG) BY MOUTH DAILY 30 tablet 6  . ATROVENT HFA 17 MCG/ACT inhaler Inhale 2 puffs into the lungs every 6 (six) hours as needed for wheezing.   0  . dronedarone (MULTAQ) 400 MG tablet  TAKE 1 BY MOUTH TWICE DAILY( EVERY 12 HOURS) WITH A MEAL 180 tablet 2  . guaiFENesin (MUCINEX) 600 MG 12 hr tablet Take 600 mg by mouth 2 (two) times daily.     [name] loratadine (CLARITIN) 10 MG tablet Take 10 mg by mouth daily.    . metoprolol tartrate (LOPRESSOR) 25 MG tablet Take 1 tablet (25 mg total) by mouth 2 (two) times daily. 180 tablet 1  . Multiple Vitamin (MULTIVITAMIN) tablet Take 1 tablet by mouth daily.    . nitroGLYCERIN (NITROSTAT) 0.4 MG SL tablet place 1 tablet under the tongue every 5 minutes for UP TO 3 doses if needed for angina as directed by prescriber 25 tablet 3  . Omega-3 Fatty Acids (FISH OIL) 1000 MG CAPS Take 2,000 mg by mouth 2 (two) times daily.    [name] QVAR 80 MCG/ACT inhaler Inhale 2 puffs into the lungs 2 (two) times daily.   1  . rivaroxaban (XARELTO) 20 MG TABS tablet TAKE 1 TABLET BY MOUTH DAILY WITH SUPPER 14 tablet 0  . SPIRIVA HANDIHALER 18 MCG inhalation capsule Place 18 mcg into inhaler and inhale daily.   1  . VENTOLIN HFA 108 (90 BASE) MCG/ACT inhaler Inhale 2 puffs into the lungs every 4 (four) hours as needed for wheezing or shortness of breath.   1   No current facility-administered medications  for this visit.    Physical Exam: Vitals:   05/07/20 0839  BP: (!) 127/96  Pulse: (!) 137  Temp: 97.9 F (36.6 C)  SpO2: 97%    GEN- The patient is overweight appearing, alert and oriented x 3 today.   Head- normocephalic, atraumatic Eyes-  Sclera clear, conjunctiva pink Ears- hearing intact Oropharynx- clear Lungs-  , normal work of breathing Heart- irregular rate and rhythm  GI- soft, NT, ND, + BS Extremities- no clubbing, cyanosis, or edema     Wt Readings from Last 3 Encounters:  03/21/20 260 lb 12.8 oz (118.3 kg)  10/18/19 237 lb 3.2 oz (107.6 kg)  04/16/19 228 lb (103.4 kg)    Cardiac CT reviewed with him at length today.  Assessment and Plan:  1. Persistent atrial fibrillation chads2vasc score is 3.  He is on  xarelto He reports compliance without interruption. Risk, benefits, and alternatives to EP study and radiofrequency ablation for afib were also discussed in detail today. These risks include but are not limited to stroke, bleeding, vascular damage, tamponade, perforation, damage to the esophagus, lungs, and other structures, pulmonary vein stenosis, worsening renal function, and death. The patient understands these risk and wishes to proceed.    2. HTN Stable No change required today bmet  3. CAD No ischemic symptoms cardiac CT reviewed myoview 2019 is reviewed   Thompson Grayer MD, [place] 05/07/2020 11:23 AM

## 2020-05-07 NOTE — Transfer of Care (Signed)
Immediate Anesthesia Transfer of Care Note  Patient: [name]  Procedure(s) Performed: ATRIAL FIBRILLATION ABLATION (N/A )  Patient Location: PACU  Anesthesia Type:General  Level of Consciousness: awake, oriented and patient cooperative  Airway & Oxygen Therapy: Patient Spontanous Breathing and Patient connected to face mask oxygen  Post-op Assessment: Report given to RN and Post -op Vital signs reviewed and stable  Post vital signs: Reviewed  Last Vitals:  Vitals Value Taken Time  BP    Temp    Pulse 94 05/07/20 1348  Resp 21 05/07/20 1348  SpO2 100 % 05/07/20 1348  Vitals shown include unvalidated device data.  Last Pain:  Vitals:   05/07/20 0854  TempSrc:   PainSc: 4       Patients Stated Pain Goal: 4 (27/12/92 9090)  Complications: No complications documented.

## 2020-05-07 NOTE — Anesthesia Preprocedure Evaluation (Addendum)
Anesthesia Evaluation  Patient identified by MRN, date of birth, ID band Patient awake    Reviewed: Allergy & Precautions, NPO status , Patient's Chart, lab work & pertinent test results  Airway Mallampati: I  TM Distance: >3 FB Neck ROM: Full    Dental  (+) Edentulous Upper, Edentulous Lower   Pulmonary asthma , COPD,  COPD inhaler, former smoker,    breath sounds clear to auscultation       Cardiovascular hypertension, Pt. on home beta blockers + CAD and + Cardiac Stents  + dysrhythmias Atrial Fibrillation  Rhythm:Irregular Rate:Abnormal     Neuro/Psych negative neurological ROS  negative psych ROS   GI/Hepatic negative GI ROS, Neg liver ROS,   Endo/Other  negative endocrine ROS  Renal/GU negative Renal ROS     Musculoskeletal negative musculoskeletal ROS (+)   Abdominal (+) + obese,   Peds  Hematology negative hematology ROS (+)   Anesthesia Other Findings   Reproductive/Obstetrics                            Anesthesia Physical Anesthesia Plan  ASA: III  Anesthesia Plan: General   Post-op Pain Management:    Induction: Intravenous  PONV Risk Score and Plan: 2 and Ondansetron and Midazolam  Airway Management Planned: Oral ETT  Additional Equipment: None  Intra-op Plan:   Post-operative Plan: Extubation in OR  Informed Consent: I have reviewed the patients History and Physical, chart, labs and discussed the procedure including the risks, benefits and alternatives for the proposed anesthesia with the patient or authorized representative who has indicated his/her understanding and acceptance.     Dental advisory given  Plan Discussed with: CRNA  Anesthesia Plan Comments: (Echo:  1. Left ventricular ejection fraction, by estimation, is 50 to 55%. The  left ventricle has low normal function. The left ventricle has no regional  wall motion abnormalities. There is mild  left ventricular hypertrophy.  Left ventricular diastolic  parameters are indeterminate.  2. Right ventricular systolic function is normal. The right ventricular  size is normal.  3. The mitral valve is normal in structure. No evidence of mitral valve  regurgitation. No evidence of mitral stenosis.  4. The aortic valve is tricuspid. Aortic valve regurgitation is mild. No  aortic stenosis is present.  5. The inferior vena cava is normal in size with greater than 50%  respiratory variability, suggesting right atrial pressure of 3 mmHg. )      Anesthesia Quick Evaluation

## 2020-05-08 ENCOUNTER — Encounter (HOSPITAL_COMMUNITY): Payer: Self-pay | Admitting: Internal Medicine

## 2020-05-08 ENCOUNTER — Telehealth: Payer: Self-pay | Admitting: Internal Medicine

## 2020-05-08 NOTE — Telephone Encounter (Signed)
It looks like this was stopped by Dr. Rayann Heman, I would defer to him.  Could always get input from Pharm.D. as well if there are specific concerns.

## 2020-05-08 NOTE — Telephone Encounter (Signed)
I do not think there was a specific concern. Possible d/c since its technically duplicate therapy with zyrtec. Both 2nd generation antihistamines. If he doesn't get enough relief with just zyrtec, he could try switching to Xyzal or allegra. Would avoid taking 2 different allergy meds unless he was specifically told to do so by his allergist.

## 2020-05-08 NOTE — Anesthesia Postprocedure Evaluation (Signed)
Anesthesia Post Note  Patient: [name]  Procedure(s) Performed: ATRIAL FIBRILLATION ABLATION (N/A )     Patient location during evaluation: PACU Anesthesia Type: General Level of consciousness: awake and alert Pain management: pain level controlled Vital Signs Assessment: post-procedure vital signs reviewed and stable Respiratory status: spontaneous breathing, nonlabored ventilation, respiratory function stable and patient connected to nasal cannula oxygen Cardiovascular status: blood pressure returned to baseline and stable Postop Assessment: no apparent nausea or vomiting Anesthetic complications: no   No complications documented.  Last Vitals:  Vitals:   05/07/20 1600 05/07/20 1700  BP: (!) 143/101 120/87  Pulse: 100 (!) 129  Resp: 20 16  Temp:    SpO2: 94% 94%    Last Pain:  Vitals:   05/07/20 1512  TempSrc:   PainSc: 0-No pain                 Effie Berkshire

## 2020-05-08 NOTE — Telephone Encounter (Signed)
Pt wanting to know what he can take in place of Claritin - says he was told at d/c yesterday to stop Claritin and was on his discharge instructions - already takes Zyrtec but wants additional allergy medication that would be safe to take

## 2020-05-08 NOTE — Telephone Encounter (Signed)
Patient's wife called to see why the Claritin(allergy medication) was removed from his current medications. He has been taking for over 30 years for his allergies and said if Dr does not want him to take they need to know what he can take. Please call back.

## 2020-05-09 ENCOUNTER — Other Ambulatory Visit: Payer: Self-pay | Admitting: *Deleted

## 2020-05-09 NOTE — Telephone Encounter (Signed)
Patient wife called again wants to know what patient can take for his sinuses if he is to discontinue the medication he was taking?

## 2020-05-09 NOTE — Telephone Encounter (Signed)
Pt and wife voiced understanding and appreciative will continue to just take Zyrtec

## 2020-05-16 ENCOUNTER — Encounter: Payer: Medicare Other | Admitting: Internal Medicine

## 2020-05-17 ENCOUNTER — Telehealth: Payer: Self-pay | Admitting: Cardiology

## 2020-05-17 NOTE — Telephone Encounter (Signed)
I have received a call from Mr. Warehime wife Novella Olive stating that Mr. Hammack has been accumulating fluid in his feet/ankles and possibly in his belly area. He has taken an extra dose of his Lasix without much benefit. I have recommended Mr. Pavone take another 40mg  lasix dose and see if he stats to have more urine output. Mr. Steelman currently does not complain about palpitations, chest pain, orthopnea, PND. Patient's wife and patient were in agreement with this plan.   Signed: Hessie Knows 05/17/2020, 11:10 PM

## 2020-05-19 ENCOUNTER — Telehealth: Payer: Self-pay | Admitting: Internal Medicine

## 2020-05-19 MED ORDER — FUROSEMIDE 20 MG PO TABS: 20.0000 mg | ORAL_TABLET | Freq: Every day | ORAL | 1 refills | Status: DC

## 2020-05-19 NOTE — Telephone Encounter (Signed)
Pt c/o swelling: STAT is pt has developed SOB within 24 hours  1) How much weight have you gained and in what time span? Patient does not have a scale   2) If swelling, where is the swelling located? Feet and legs  3) Are you currently taking a fluid pill? yes  4) Are you currently SOB? A little, but patient has COPD  5) Do you have a log of your daily weights (if so, list)? No. Patient does not have a scale  6) Have you gained 3 pounds in a day or 5 pounds in a week? Not sure   7) Have you traveled recently? No  Wife called the on call provider over the weekend and he was advised to take an additional 40 mg of the fluid pill to pull off the water. The patient is still experiencing swelling and the wife does not know what to do. Please advise.

## 2020-05-19 NOTE — Telephone Encounter (Signed)
Pt and wife voiced understanding

## 2020-05-19 NOTE — Telephone Encounter (Signed)
Pt c/o increased swelling in feet/ankles/legs over the weekend - doesn't know what weight has been as he doesn't have a scale - per recent phone notes from on call provider pt was to take 40 mg of lasix which pt did Saturday and Sunday - says the swelling has gone down some but not a lot - pt took 20 mg of lasix this morning - denies increased SOB/palpitations - HR 104 this morning has appt with afib clinic 3/23 - wanted to know how he should take his lasix - was taking 20 mg prn

## 2020-05-19 NOTE — Telephone Encounter (Signed)
I reviewed the chart and recent phone notes.  For now I would have him continue Lasix 20 mg daily, keep follow-up in the atrial fibrillation clinic as scheduled.

## 2020-05-19 NOTE — Telephone Encounter (Signed)
Will send to primary card  

## 2020-06-04 ENCOUNTER — Ambulatory Visit (HOSPITAL_COMMUNITY): Payer: Medicare Other | Admitting: Physician Assistant

## 2020-06-12 ENCOUNTER — Ambulatory Visit (HOSPITAL_COMMUNITY)
Admission: RE | Admit: 2020-06-12 | Discharge: 2020-06-12 | Disposition: A | Discharge status: Home / Self Care | Payer: Medicare Other | Source: Ambulatory Visit | Attending: Physician Assistant | Admitting: Physician Assistant

## 2020-06-12 ENCOUNTER — Other Ambulatory Visit: Payer: Self-pay

## 2020-06-12 ENCOUNTER — Encounter (HOSPITAL_COMMUNITY): Payer: Self-pay | Admitting: Physician Assistant

## 2020-06-12 VITALS — BP 124/88 | HR 97 | Ht 70.0 in | Wt 259.2 lb

## 2020-06-12 DIAGNOSIS — I2510 Atherosclerotic heart disease of native coronary artery without angina pectoris: Secondary | ICD-10-CM | POA: Diagnosis not present

## 2020-06-12 DIAGNOSIS — I4819 Other persistent atrial fibrillation: Secondary | ICD-10-CM | POA: Insufficient documentation

## 2020-06-12 DIAGNOSIS — Z79899 Other long term (current) drug therapy: Secondary | ICD-10-CM | POA: Diagnosis not present

## 2020-06-12 DIAGNOSIS — Z8249 Family history of ischemic heart disease and other diseases of the circulatory system: Secondary | ICD-10-CM | POA: Insufficient documentation

## 2020-06-12 DIAGNOSIS — Z713 Dietary counseling and surveillance: Secondary | ICD-10-CM | POA: Diagnosis not present

## 2020-06-12 DIAGNOSIS — E669 Obesity, unspecified: Secondary | ICD-10-CM | POA: Insufficient documentation

## 2020-06-12 DIAGNOSIS — Z87891 Personal history of nicotine dependence: Secondary | ICD-10-CM | POA: Diagnosis not present

## 2020-06-12 DIAGNOSIS — D6869 Other thrombophilia: Secondary | ICD-10-CM | POA: Diagnosis not present

## 2020-06-12 DIAGNOSIS — Z6837 Body mass index (BMI) 37.0-37.9, adult: Secondary | ICD-10-CM | POA: Diagnosis not present

## 2020-06-12 DIAGNOSIS — Z7901 Long term (current) use of anticoagulants: Secondary | ICD-10-CM | POA: Diagnosis not present

## 2020-06-12 DIAGNOSIS — I10 Essential (primary) hypertension: Secondary | ICD-10-CM | POA: Insufficient documentation

## 2020-06-12 NOTE — Progress Notes (Signed)
Primary Care Physician: Rory Percy, MD Primary Cardiologist: Dr Domenic Polite Primary Electrophysiologist: Dr Rayann Heman Referring Physician: Dr Solmon Ice is a 65 y.o. male with a history of CAD, HLD, HTN, and atrial fibrillation who presents for follow up in the [facility]. Patient has been maintained on Multaq. Patient is on Xarelto for a CHADS2VASC score of 3. He was found to be in persistent afib and underwent DCCV on 04/03/20 which was unsuccessful.   On follow up today, patient is s/p afib ablation 05/07/20 with Dr Rayann Heman. He reports that he had one afib episode soon after ablation which lasted about 4 hours but he has had none since. He denies CP, swallowing pain, or groin issues. He denies any bleeding issues on anticoagulation.    Today, he denies symptoms of palpitations, chest pain, orthopnea, PND, dizziness, presyncope, syncope, snoring, daytime somnolence, bleeding, or neurologic sequela. The patient is tolerating medications without difficulties and is otherwise without complaint today.    Atrial Fibrillation Risk Factors:  he does not have symptoms or diagnosis of sleep apnea. Negative sleep study. he does not have a history of rheumatic fever.   he has a BMI of Body mass index is 37.19 kg/m.[name] Filed Weights   06/12/20 0944  Weight: 117.6 kg    Family History  Problem Relation Age of Onset  . Coronary artery disease Other   . Cancer Mother   . Diabetes Mother   . Heart disease Mother   . COPD Father   . High blood pressure Father   . Heart disease Father      Atrial Fibrillation Management history:  Previous antiarrhythmic drugs: Multaq Previous cardioversions: 04/03/20 Previous ablations: 05/07/20 CHADS2VASC score: 3 Anticoagulation history: Xarelto   Past Medical History:  Diagnosis Date  . Asthma   . COPD (chronic obstructive pulmonary disease) ([place])   . Coronary atherosclerosis of native coronary artery     a. s/p DES to RCA in 2006 b. DES to LCx in 2012 with CTO of RCA noted at that time  . Hypertension   . Mixed hyperlipidemia   . Myocardial infarction ([place])    NSTEMI 4/12  . Paroxysmal atrial fibrillation ([facility])   . Seasonal allergies   . Skin cancer    Past Surgical History:  Procedure Laterality Date  . ATRIAL FIBRILLATION ABLATION N/A 05/07/2020   Procedure: ATRIAL FIBRILLATION ABLATION;  Surgeon: Thompson Grayer, MD;  Location: [facility];  Service: Cardiovascular;  Laterality: N/A;  . CARDIAC CATHETERIZATION N/A 07/26/2014   Procedure: Left Heart Cath and Coronary Angiography;  Surgeon: Peter M Martinique, MD;  Location: [facility];  Service: Cardiovascular;  Laterality: N/A;  . CARDIOVERSION N/A 04/03/2020   Procedure: CARDIOVERSION;  Surgeon: Satira Sark, MD;  Location: [facility];  Service: Cardiovascular;  Laterality: N/A;  . CORONARY ANGIOPLASTY     stents   . CYSTOSCOPY  01/31/2013  . Nasal reconstruction surgery      Current Outpatient Medications  Medication Sig Dispense Refill  . acetaminophen (TYLENOL) 500 MG tablet Take 1,000 mg by mouth every 6 (six) hours as needed for moderate pain.    [name] albuterol (PROVENTIL) (2.5 MG/3ML) 0.083% nebulizer solution Take 2.5 mg by nebulization every 6 (six) hours as needed for wheezing or shortness of breath.    [name] atorvastatin (LIPITOR) 80 MG tablet TAKE 1 TABLET(80 MG) BY MOUTH DAILY 30 tablet 6  . cetirizine (ZYRTEC) 10 MG tablet Take 10 mg  by mouth daily.    . furosemide (LASIX) 20 MG tablet Take 1 tablet (20 mg total) by mouth daily. 90 tablet 1  . guaiFENesin (MUCINEX) 600 MG 12 hr tablet Take 600 mg by mouth 2 (two) times daily.     [name] ibuprofen (ADVIL) 200 MG tablet Take 400 mg by mouth every 6 (six) hours as needed for moderate pain.    [name] ipratropium-albuterol (DUONEB) 0.5-2.5 (3) MG/3ML SOLN SMARTSIG:3 Milliliter(s) Via Nebulizer 4 Times Daily PRN    . metoprolol tartrate (LOPRESSOR) 25 MG tablet Take 1 tablet (25 mg  total) by mouth 2 (two) times daily. 180 tablet 1  . Multiple Vitamin (MULTIVITAMIN) tablet Take 1 tablet by mouth daily.    . nitroGLYCERIN (NITROSTAT) 0.4 MG SL tablet place 1 tablet under the tongue every 5 minutes for UP TO 3 doses if needed for angina as directed by prescriber 25 tablet 3  . Omega-3 Fatty Acids (FISH OIL) 1000 MG CAPS Take 1,000 mg by mouth 2 (two) times daily.    . pantoprazole (PROTONIX) 40 MG tablet Take 1 tablet (40 mg total) by mouth daily. 45 tablet 0  . XARELTO 20 MG TABS tablet TAKE 1 TABLET BY MOUTH DAILY WITH SUPPER 30 tablet 11   No current facility-administered medications for this encounter.    No Known Allergies  Social History   Socioeconomic History  . Marital status: Married    Spouse name: Not on file  . Number of children: Not on file  . Years of education: Not on file  . Highest education level: Not on file  Occupational History  . Occupation: Agricultural consultant  Tobacco Use  . Smoking status: Former Smoker    Packs/day: 1.00    Years: 30.00    Pack years: 30.00    Types: Cigarettes    Start date: 03/12/1970    Quit date: 08/13/2017    Years since quitting: 2.8  . Smokeless tobacco: Never Used  Vaping Use  . Vaping Use: Never used  Substance and Sexual Activity  . Alcohol use: No    Alcohol/week: 0.0 standard drinks  . Drug use: No  . Sexual activity: Yes  Other Topics Concern  . Not on file  Social History Narrative   Lives in [place] with spouse.   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  Physical Exam: Vitals:   06/12/20 0944  BP: 124/88  Pulse: 97  Weight: 117.6 kg  Height: 5\' 10"  (1.778 m)     GEN- The patient is a well appearing obese male, alert and oriented x 3 today.   HEENT-head normocephalic, atraumatic, sclera clear, conjunctiva pink, hearing intact, trachea midline. Lungs- Clear to ausculation bilaterally, normal work of breathing Heart- Regular rate and rhythm, no murmurs, rubs or gallops  GI- soft, NT, ND, + BS Extremities- no clubbing, cyanosis, or edema MS- no significant deformity or atrophy Skin- no rash or lesion Psych- euthymic mood, full affect Neuro- strength and sensation are intact   Wt Readings from Last 3 Encounters:  06/12/20 117.6 kg  05/07/20 117.9 kg  04/07/20 119.6 kg    EKG  today demonstrates  SR, 1st degree AV block Vent. rate 97 BPM PR interval 208 ms QRS duration 88 ms QT/QTcB 370/469 ms   Echo 04/09/20 demonstrated  1. Left ventricular ejection fraction, by estimation, is 50 to 55%. The  left ventricle has low normal function. The left ventricle has no regional  wall motion abnormalities. There is mild left ventricular hypertrophy.  Left ventricular diastolic  parameters are indeterminate.  2. Right ventricular systolic function is normal. The right ventricular  size is normal.  3. The mitral valve is normal in structure. No evidence of mitral valve  regurgitation. No evidence of mitral stenosis.  4. The aortic valve is tricuspid. Aortic valve regurgitation is mild. No  aortic stenosis is present.  5. The inferior vena cava is normal in size with greater than 50%  respiratory variability, suggesting right atrial pressure of 3 mmHg.  Epic records are reviewed at length today  CHA2DS2-VASc Score = 3  The patient's score is based upon: CHF History: No HTN History: Yes Diabetes History: No Stroke History: No Vascular Disease History: Yes Age Score: 1 Gender Score: 0      ASSESSMENT AND PLAN: 1. Persistent Atrial Fibrillation (ICD10:  I48.19) The patient's CHA2DS2-VASc score is 3, indicating a 3.2% annual risk of  stroke.   S/p afib ablation 05/07/20 Patient appears to be maintaining SR. Continue Lopressor 25 mg BID Continue Xarelto 20 mg daily with no missed doses for 3 months post ablation.   2. Secondary Hypercoagulable State (ICD10:  D68.69) The patient is at significant risk for stroke/thromboembolism based upon his CHA2DS2-VASc Score of 3.  Continue Rivaroxaban (Xarelto).   3. Obesity Body mass index is 37.19 kg/m. Lifestyle modification was discussed and encouraged including regular physical activity and weight reduction.  4. HTN Stable, no changes today.  5. CAD No anginal symptoms.   Follow up with Dr Domenic Polite and Dr Rayann Heman as scheduled.    [facility] [address] [place], [place] [address] [phone] 06/12/2020 9:51 AM

## 2020-06-18 ENCOUNTER — Ambulatory Visit: Payer: Medicare Other | Admitting: Cardiology

## 2020-07-03 ENCOUNTER — Encounter (HOSPITAL_COMMUNITY): Payer: Self-pay | Admitting: Cardiology

## 2020-07-22 ENCOUNTER — Other Ambulatory Visit: Payer: Self-pay | Admitting: Cardiology

## 2020-08-22 ENCOUNTER — Encounter: Payer: Self-pay | Admitting: Internal Medicine

## 2020-08-22 ENCOUNTER — Other Ambulatory Visit: Payer: Self-pay

## 2020-08-22 ENCOUNTER — Ambulatory Visit: Payer: Medicare Other | Admitting: Internal Medicine

## 2020-08-22 VITALS — BP 136/80 | HR 72 | Ht 70.0 in | Wt 249.0 lb

## 2020-08-22 DIAGNOSIS — I4819 Other persistent atrial fibrillation: Secondary | ICD-10-CM

## 2020-08-22 DIAGNOSIS — D6869 Other thrombophilia: Secondary | ICD-10-CM | POA: Diagnosis not present

## 2020-08-22 DIAGNOSIS — I10 Essential (primary) hypertension: Secondary | ICD-10-CM | POA: Diagnosis not present

## 2020-08-22 NOTE — Patient Instructions (Signed)
Medication Instructions:  Continue all current medications.  Labwork: none  Testing/Procedures: none  Follow-Up: 4 months   Any Other Special Instructions Will Be Listed Below (If Applicable).  If you need a refill on your cardiac medications before your next appointment, please call your pharmacy.\ 

## 2020-08-22 NOTE — Progress Notes (Signed)
PCP: Schubert Nation, MD    [name] is a 65 y.o. male who presents today for routine electrophysiology followup.  Since his recent afib ablation, the patient reports doing very well.  he denies procedure related complications and is pleased with the results of the procedure.  Today, he denies symptoms of palpitations, chest pain, shortness of breath,  lower extremity edema, dizziness, presyncope, or syncope.  The patient is otherwise without complaint today.   Past Medical History:  Diagnosis Date   Asthma    COPD (chronic obstructive pulmonary disease) ([place])    Coronary atherosclerosis of native coronary artery    a. s/p DES to RCA in 2006 b. DES to LCx in 2012 with CTO of RCA noted at that time   Hypertension    Mixed hyperlipidemia    Myocardial infarction [facility])    NSTEMI 4/12   Paroxysmal atrial fibrillation ([facility])    Seasonal allergies    Skin cancer    Past Surgical History:  Procedure Laterality Date   ATRIAL FIBRILLATION ABLATION N/A 05/07/2020   Procedure: ATRIAL FIBRILLATION ABLATION;  Surgeon: Thompson Grayer, MD;  Location: [place] [facility];  Service: Cardiovascular;  Laterality: N/A;   CARDIAC CATHETERIZATION N/A 07/26/2014   Procedure: Left Heart Cath and Coronary Angiography;  Surgeon: Peter M Martinique, MD;  Location: [facility];  Service: Cardiovascular;  Laterality: N/A;   CARDIOVERSION N/A 04/03/2020   Procedure: CARDIOVERSION;  Surgeon: Satira Sark, MD;  Location: [facility];  Service: Cardiovascular;  Laterality: N/A;   CORONARY ANGIOPLASTY     stents    CYSTOSCOPY  01/31/2013   Nasal reconstruction surgery      ROS- all systems are personally reviewed and negatives except as per HPI above  Current Outpatient Medications  Medication Sig Dispense Refill   acetaminophen (TYLENOL) 500 MG tablet Take 1,000 mg by mouth every 6 (six) hours as needed for moderate pain.     atorvastatin (LIPITOR) 80 MG tablet TAKE 1 TABLET(80 MG) BY MOUTH DAILY  30 tablet 6   cetirizine (ZYRTEC) 10 MG tablet Take 10 mg by mouth daily.     furosemide (LASIX) 20 MG tablet Take 1 tablet (20 mg total) by mouth daily. 90 tablet 1   guaiFENesin (MUCINEX) 600 MG 12 hr tablet Take 600 mg by mouth 2 (two) times daily.      ibuprofen (ADVIL) 200 MG tablet Take 400 mg by mouth every 6 (six) hours as needed for moderate pain.     ipratropium-albuterol (DUONEB) 0.5-2.5 (3) MG/3ML SOLN SMARTSIG:3 Milliliter(s) Via Nebulizer 4 Times Daily PRN     metoprolol tartrate (LOPRESSOR) 25 MG tablet TAKE 1 TABLET(25 MG) BY MOUTH TWICE DAILY 180 tablet 1   Multiple Vitamin (MULTIVITAMIN) tablet Take 1 tablet by mouth daily.     nitroGLYCERIN (NITROSTAT) 0.4 MG SL tablet place 1 tablet under the tongue every 5 minutes for UP TO 3 doses if needed for angina as directed by prescriber 25 tablet 3   XARELTO 20 MG TABS tablet TAKE 1 TABLET BY MOUTH DAILY WITH SUPPER 30 tablet 11   No current facility-administered medications for this visit.    Physical Exam: Vitals:   08/22/20 1451  BP: 136/80  Pulse: 72  SpO2: 92%  Weight: 249 lb (112.9 kg)  Height: 5\' 10"  (1.778 m)    GEN- The patient is well appearing, alert and oriented x 3 today.   Head- normocephalic, atraumatic Eyes-  Sclera clear, conjunctiva pink Ears-  hearing intact Oropharynx- clear Lungs- Clear to ausculation bilaterally, normal work of breathing Heart- Regular rate and rhythm, no murmurs, rubs or gallops, PMI not laterally displaced GI- soft, NT, ND, + BS Extremities- no clubbing, cyanosis, or edema  EKG tracing ordered today is personally reviewed and shows sinus  Assessment and Plan:  1. Persistent atrial fibrillation Doing well s/p ablation off multaq chads2vasc score is 3.  He is on xarelto  2. HTN Stable No change required today  3. CAD No ischemic symptoms  4. Tobacco Remains quit   Return to see me in 4 months  Thompson Grayer MD, [facility] 08/22/2020 3:14 PM

## 2020-08-24 ENCOUNTER — Other Ambulatory Visit: Payer: Self-pay | Admitting: Cardiology

## 2020-10-06 ENCOUNTER — Encounter: Payer: Self-pay | Admitting: Cardiology

## 2020-10-06 ENCOUNTER — Other Ambulatory Visit: Payer: Self-pay

## 2020-10-06 ENCOUNTER — Ambulatory Visit: Payer: Medicare Other | Admitting: Cardiology

## 2020-10-06 VITALS — BP 122/88 | HR 86 | Ht 70.0 in | Wt 238.0 lb

## 2020-10-06 DIAGNOSIS — I4819 Other persistent atrial fibrillation: Secondary | ICD-10-CM

## 2020-10-06 DIAGNOSIS — I25119 Atherosclerotic heart disease of native coronary artery with unspecified angina pectoris: Secondary | ICD-10-CM | POA: Diagnosis not present

## 2020-10-06 NOTE — Progress Notes (Signed)
Cardiology Office Note  Date: 10/06/2020   ID: Edward, Patton 05-29-1955, MRN [name]:[id number]  PCP:  [place], MD  Cardiologist:  Rozann Lesches, MD Electrophysiologist:  Thompson Grayer, MD   Chief Complaint  Patient presents with   Cardiac follow-up    History of Present Illness: [name] is a 65 y.o. male last seen in August 2021.  He has had interval follow-up in the atrial fibrillation clinic as well as with Dr. Rayann Heman, I reviewed the recent note from June.  He underwent atrial fibrillation ablation and has done reasonably well.  He is no longer on Multaq.  He is here with his wife for follow-up, reports better stamina, he has been more active and lost weight, still enjoying retirement.  He does not report any active angina at this time.  I reviewed his recent lab work, LDL 53.  He is following with Dr. Jimmye Norman at [place].  I personally reviewed his ECG today which shows sinus rhythm with PVC.  Past Medical History:  Diagnosis Date   Asthma    COPD (chronic obstructive pulmonary disease) ([place])    Coronary atherosclerosis of native coronary artery    a. s/p DES to RCA in 2006 b. DES to LCx in 2012 with CTO of RCA noted at that time   Hypertension    Mixed hyperlipidemia    Myocardial infarction [facility])    NSTEMI 4/12   Paroxysmal atrial fibrillation (HCC)    Seasonal allergies    Skin cancer     Past Surgical History:  Procedure Laterality Date   ATRIAL FIBRILLATION ABLATION N/A 05/07/2020   Procedure: ATRIAL FIBRILLATION ABLATION;  Surgeon: Thompson Grayer, MD;  Location: [place] [facility];  Service: Cardiovascular;  Laterality: N/A;   CARDIAC CATHETERIZATION N/A 07/26/2014   Procedure: Left Heart Cath and Coronary Angiography;  Surgeon: Peter M Martinique, MD;  Location: [place] [facility];  Service: Cardiovascular;  Laterality: N/A;   CARDIOVERSION N/A 04/03/2020   Procedure: CARDIOVERSION;  Surgeon: Satira Sark, MD;  Location: [facility];   Service: Cardiovascular;  Laterality: N/A;   CORONARY ANGIOPLASTY     stents    CYSTOSCOPY  01/31/2013   Nasal reconstruction surgery      Current Outpatient Medications  Medication Sig Dispense Refill   acetaminophen (TYLENOL) 500 MG tablet Take 1,000 mg by mouth every 6 (six) hours as needed for moderate pain.     atorvastatin (LIPITOR) 80 MG tablet TAKE 1 TABLET(80 MG) BY MOUTH DAILY 90 tablet 1   cetirizine (ZYRTEC) 10 MG tablet Take 10 mg by mouth daily.     furosemide (LASIX) 20 MG tablet TAKE 1 TABLET(20 MG) BY MOUTH DAILY 90 tablet 1   guaiFENesin (MUCINEX) 600 MG 12 hr tablet Take 600 mg by mouth 2 (two) times daily.      ibuprofen (ADVIL) 200 MG tablet Take 400 mg by mouth every 6 (six) hours as needed for moderate pain.     ipratropium-albuterol (DUONEB) 0.5-2.5 (3) MG/3ML SOLN SMARTSIG:3 Milliliter(s) Via Nebulizer 4 Times Daily PRN     metoprolol tartrate (LOPRESSOR) 25 MG tablet TAKE 1 TABLET(25 MG) BY MOUTH TWICE DAILY 180 tablet 1   Multiple Vitamin (MULTIVITAMIN) tablet Take 1 tablet by mouth daily.     nitroGLYCERIN (NITROSTAT) 0.4 MG SL tablet place 1 tablet under the tongue every 5 minutes for UP TO 3 doses if needed for angina as directed by prescriber 25 tablet 3   XARELTO 20 MG  TABS tablet TAKE 1 TABLET BY MOUTH DAILY WITH SUPPER 30 tablet 11   No current facility-administered medications for this visit.   Allergies:  Patient has no known allergies.   ROS: No orthopnea or PND.  Physical Exam: VS:  BP 122/88   Pulse 86   Ht '5\' 10"'$  (1.778 m)   Wt 238 lb (108 kg)   SpO2 94%   BMI 34.15 kg/m , BMI Body mass index is 34.15 kg/m.  Wt Readings from Last 3 Encounters:  10/06/20 238 lb (108 kg)  08/22/20 249 lb (112.9 kg)  06/12/20 259 lb 3.2 oz (117.6 kg)    General: Patient appears comfortable at rest. HEENT: Conjunctiva and lids normal, wearing a mask. Neck: Supple, no elevated JVP or carotid bruits, no thyromegaly. Lungs: Clear to auscultation,  nonlabored breathing at rest. Cardiac: Regular rate and rhythm, no S3 or significant systolic murmur, no pericardial rub. Extremities: No pitting edema.  ECG:  An ECG dated 08/22/2020 was personally reviewed today and demonstrated:  Sinus rhythm.  Recent Labwork: 04/01/2020: ALT 42; AST 23 04/17/2020: BUN 16; Creatinine, Ser 1.02; Hemoglobin 17.6; Platelets 358; Potassium 4.3; Sodium 140  May 2022: Hemoglobin 16.8, platelets 352, BUN 16, creatinine 0.9, potassium 4.3, AST 14, ALT 26, cholesterol 106, triglycerides 144, HDL 28, LDL 53, hemoglobin A1c 6.2%  Other Studies Reviewed Today:  Lexiscan Myoview 07/06/2017: There was no ST segment deviation noted during stress. Findings consistent with prior large inferior myocardial infarction with mild peri-infarct ischemia. This is a low risk study. Minimal myocardium currently at jeopardy. The left ventricular ejection fraction is low normal (50%).  Echocardiogram 04/09/2020:  1. Left ventricular ejection fraction, by estimation, is 50 to 55%. The  left ventricle has low normal function. The left ventricle has no regional  wall motion abnormalities. There is mild left ventricular hypertrophy.  Left ventricular diastolic  parameters are indeterminate.   2. Right ventricular systolic function is normal. The right ventricular  size is normal.   3. The mitral valve is normal in structure. No evidence of mitral valve  regurgitation. No evidence of mitral stenosis.   4. The aortic valve is tricuspid. Aortic valve regurgitation is mild. No  aortic stenosis is present.   5. The inferior vena cava is normal in size with greater than 50%  respiratory variability, suggesting right atrial pressure of 3 mmHg.   Assessment and Plan:  1.  Persistent atrial fibrillation with CHA2DS2-VASc score of 3.  He is status post successful atrial fibrillation ablation in February and is in sinus rhythm today.  Continue Xarelto for stroke prophylaxis and also  Lopressor.  2.  CAD status post DES to the circumflex and known occlusion of the mid RCA that has been managed medically.  He does not report any active angina.  Continue statin therapy.  His most recent LDL was 53.  Medication Adjustments/Labs and Tests Ordered: Current medicines are reviewed at length with the patient today.  Concerns regarding medicines are outlined above.   Tests Ordered: Orders Placed This Encounter  Procedures   EKG 12-Lead     Medication Changes: No orders of the defined types were placed in this encounter.   Disposition:  Follow up  6 months.  Signed, Satira Sark, MD, [facility] 10/06/2020 3:07 PM    [facility] [place], [place], [place] [address] Phone: [phone]; Fax: [phone]

## 2020-10-06 NOTE — Patient Instructions (Addendum)

## 2020-10-21 ENCOUNTER — Telehealth: Payer: Self-pay | Admitting: Internal Medicine

## 2020-10-21 MED ORDER — RIVAROXABAN 20 MG PO TABS: 20.0000 mg | ORAL_TABLET | Freq: Every day | ORAL | 0 refills | Status: DC

## 2020-10-21 NOTE — Telephone Encounter (Signed)
Notified wife Novella Olive) samples are ready for pick up.  Informed her that if cost moving forward becomes a problem, he could always try apply for patient assistance. She verbalized understanding and will pick up tomorrow.

## 2020-10-21 NOTE — Telephone Encounter (Signed)
New message     Patient calling the office for samples of medication:   1.  What medication and dosage are you requesting samples for?  Xarelto 20 mg  2.  Are you currently out of this medication?  Has 3 days left,  he doesn't get paid until next week can not afford to pick them up

## 2020-11-19 ENCOUNTER — Telehealth: Payer: Self-pay | Admitting: Cardiology

## 2020-11-19 NOTE — Telephone Encounter (Signed)
Pt's wife called wanting to get information for assistance on Xarelto.  Please call [phone]

## 2020-11-19 NOTE — Telephone Encounter (Signed)
Wife called requesting number for xarelto patient assistance Gave [phone] to call and request assistance

## 2020-12-02 ENCOUNTER — Other Ambulatory Visit: Payer: Self-pay | Admitting: *Deleted

## 2020-12-02 MED ORDER — RIVAROXABAN 20 MG PO TABS: 20.0000 mg | ORAL_TABLET | Freq: Every day | ORAL | 3 refills | Status: DC

## 2020-12-29 ENCOUNTER — Other Ambulatory Visit: Payer: Self-pay | Admitting: Cardiology

## 2020-12-31 ENCOUNTER — Telehealth: Payer: Self-pay | Admitting: Cardiology

## 2020-12-31 NOTE — Telephone Encounter (Signed)
Pt is in the doughnut hole for his rivaroxaban (XARELTO) 20 MG TABS tablet [[id number]]  Would like some samples   Also hasn't heard back concerning assistance   Please give pt a call @ [phone]

## 2020-12-31 NOTE — Telephone Encounter (Signed)
[employer] contacted to check status of application for xarelto assistance $73,210 income higher than what was reported per income verification Out of pocket $2928.40 Need letter as to why wrong amount was put on application-must be signed and dated Need proof of income  Need prescription out of pocket expense for 2022

## 2020-12-31 NOTE — Telephone Encounter (Signed)
Patient's wife informed and verbalized understanding of plan. 

## 2021-01-01 MED ORDER — RIVAROXABAN 20 MG PO TABS: 20.0000 mg | ORAL_TABLET | Freq: Every day | ORAL | 0 refills | Status: DC

## 2021-01-01 NOTE — Telephone Encounter (Signed)
Pt's wife called and has some questions concerning the letter she needs to write.   Please call

## 2021-01-01 NOTE — Telephone Encounter (Signed)
Explained that Need letter as to why wrong amount was put on application-must be signed and dated and dispute the amount of $73,210 since they don't have this much income per year. Verbalized understanding.

## 2021-01-01 NOTE — Addendum Note (Signed)
Addended by: Merlene Laughter on: 01/01/2021 12:23 PM   Modules accepted: Orders

## 2021-01-01 NOTE — Telephone Encounter (Signed)
Patient requesting Xarelto 20mg  samples. Indication: Atrial fib Last office visit: 10/06/20   Myles Gip MD Weight: 108kg Age: 65 Scr: 0.90 on 08/08/20 CrCl: 123.33  Based on above findings Xarelto 20mg  daily is the appropriate dose.  OK to give samples if available.

## 2021-01-07 ENCOUNTER — Telehealth: Payer: Self-pay | Admitting: *Deleted

## 2021-01-07 NOTE — Telephone Encounter (Signed)
Patient aware and will call [employer] to find out why he was denied

## 2021-01-16 ENCOUNTER — Ambulatory Visit: Payer: Medicare Other | Admitting: Internal Medicine

## 2021-02-09 ENCOUNTER — Telehealth: Payer: Self-pay | Admitting: Cardiology

## 2021-02-09 NOTE — Telephone Encounter (Signed)
Needing 30 day supply to last till end of year

## 2021-02-09 NOTE — Telephone Encounter (Signed)
Patient calling the office for samples of medication:   1.  What medication and dosage are you requesting samples for? rivaroxaban (XARELTO) 20 MG TABS tablet  2.  Are you currently out of this medication? PT IS CURRENTLY OUT OF THIS MEDICINE

## 2021-02-09 NOTE — Telephone Encounter (Signed)
Patient requesting Xarelto 20mg  samples. Indication: Atrial fib Last office visit: 10/06/20  Myles Gip MD Weight: 108kg Age: 65 Scr: 0.90 on 08/08/20 CrCl: 123.33  Based on above findings Xarelto 20mg  daily is the appropriate dose.  OK to give samples if available.  Requested lab work be rechecked at [facility] on 03/30/21.

## 2021-03-10 ENCOUNTER — Other Ambulatory Visit: Payer: Self-pay | Admitting: Cardiology

## 2021-04-06 ENCOUNTER — Other Ambulatory Visit: Payer: Self-pay | Admitting: Cardiology

## 2021-04-07 NOTE — Telephone Encounter (Signed)
Pharmacy requesting Xarelto 20mg  refills Indication: Atrial fib Last office visit: 10/06/20  Myles Gip MD Weight: 108kg Age: 66 Scr: 0.90 on 08/08/20 CrCl: 123.33   Based on above findings Xarelto 20mg  daily is the appropriate dose.  Refills approved.  Requested lab work be rechecked at [facility] on 03/30/21.

## 2021-04-14 ENCOUNTER — Encounter: Payer: Self-pay | Admitting: Cardiology

## 2021-04-14 ENCOUNTER — Ambulatory Visit: Payer: Medicare Other | Admitting: Cardiology

## 2021-04-14 VITALS — BP 102/78 | HR 80 | Ht 70.0 in | Wt 231.2 lb

## 2021-04-14 DIAGNOSIS — I4819 Other persistent atrial fibrillation: Secondary | ICD-10-CM | POA: Diagnosis not present

## 2021-04-14 DIAGNOSIS — I25119 Atherosclerotic heart disease of native coronary artery with unspecified angina pectoris: Secondary | ICD-10-CM

## 2021-04-14 NOTE — Patient Instructions (Addendum)

## 2021-04-14 NOTE — Progress Notes (Signed)
Cardiology Office Note  Date: 04/14/2021   ID: [name], [name] [birth year], MRN [id number]  PCP:  Jalene Mullet, PA-C  Cardiologist:  Rozann Lesches, MD Electrophysiologist:  Thompson Grayer, MD   Chief Complaint  Patient presents with   Cardiac follow-up    History of Present Illness: [name] is a 66 y.o. male last seen in July 2022.  He is here with his wife for a follow-up visit.  He states that he has been doing well overall, no obvious angina symptoms or nitroglycerin use.  He does not report any spontaneous bleeding problems on Xarelto.  We discussed getting follow-up at [facility] for repeat lab work.  I reviewed his medications which are otherwise noted below and stable from a cardiac perspective.  Follow-up echocardiogram from January of last year revealed LVEF approximately 50 to 55%.  Past Medical History:  Diagnosis Date   Asthma    COPD (chronic obstructive pulmonary disease) ([place])    Coronary atherosclerosis of native coronary artery    a. s/p DES to RCA in 2006 b. DES to LCx in 2012 with CTO of RCA noted at that time   Hypertension    Mixed hyperlipidemia    Myocardial infarction [facility])    NSTEMI 4/12   Paroxysmal atrial fibrillation (HCC)    Seasonal allergies    Skin cancer     Past Surgical History:  Procedure Laterality Date   ATRIAL FIBRILLATION ABLATION N/A 05/07/2020   Procedure: ATRIAL FIBRILLATION ABLATION;  Surgeon: Thompson Grayer, MD;  Location: [place] [facility];  Service: Cardiovascular;  Laterality: N/A;   CARDIAC CATHETERIZATION N/A 07/26/2014   Procedure: Left Heart Cath and Coronary Angiography;  Surgeon: Peter M Martinique, MD;  Location: [place] [facility];  Service: Cardiovascular;  Laterality: N/A;   CARDIOVERSION N/A 04/03/2020   Procedure: CARDIOVERSION;  Surgeon: Satira Sark, MD;  Location: [facility];  Service: Cardiovascular;  Laterality: N/A;   CORONARY ANGIOPLASTY     stents    CYSTOSCOPY  01/31/2013   Nasal  reconstruction surgery      Current Outpatient Medications  Medication Sig Dispense Refill   acetaminophen (TYLENOL) 500 MG tablet Take 1,000 mg by mouth every 6 (six) hours as needed for moderate pain.     atorvastatin (LIPITOR) 80 MG tablet TAKE 1 TABLET(80 MG) BY MOUTH DAILY 90 tablet 1   cetirizine (ZYRTEC) 10 MG tablet Take 10 mg by mouth daily.     furosemide (LASIX) 20 MG tablet TAKE 1 TABLET(20 MG) BY MOUTH DAILY 90 tablet 1   guaiFENesin (MUCINEX) 600 MG 12 hr tablet Take 600 mg by mouth 2 (two) times daily.      ibuprofen (ADVIL) 200 MG tablet Take 400 mg by mouth every 6 (six) hours as needed for moderate pain.     ipratropium-albuterol (DUONEB) 0.5-2.5 (3) MG/3ML SOLN SMARTSIG:3 Milliliter(s) Via Nebulizer 4 Times Daily PRN     metoprolol tartrate (LOPRESSOR) 25 MG tablet TAKE 1 TABLET(25 MG) BY MOUTH TWICE DAILY 180 tablet 1   Multiple Vitamin (MULTIVITAMIN) tablet Take 1 tablet by mouth daily.     nitroGLYCERIN (NITROSTAT) 0.4 MG SL tablet place 1 tablet under the tongue every 5 minutes for UP TO 3 doses if needed for angina as directed by prescriber 25 tablet 3   rivaroxaban (XARELTO) 20 MG TABS tablet TAKE 1 TABLET BY MOUTH DAILY WITH SUPPER 30 tablet 1   No current facility-administered medications for this visit.   Allergies:  Patient has no known allergies.   ROS: No palpitations, dizziness, or syncope.  Physical Exam: VS:  BP 102/78    Pulse 80    Ht 5\' 10"  (1.778 m)    Wt 231 lb 3.2 oz (104.9 kg)    SpO2 93%    BMI 33.17 kg/m , BMI Body mass index is 33.17 kg/m.  Wt Readings from Last 3 Encounters:  04/14/21 231 lb 3.2 oz (104.9 kg)  10/06/20 238 lb (108 kg)  08/22/20 249 lb (112.9 kg)    General: Patient appears comfortable at rest. HEENT: Conjunctiva and lids normal, wearing a mask. Neck: Supple, no elevated JVP or carotid bruits, no thyromegaly. Lungs: Clear to auscultation, nonlabored breathing at rest. Cardiac: Regular rate and rhythm, no S3 or  significant systolic murmur, no pericardial rub. Extremities: No pitting edema.  ECG:  An ECG dated 10/06/2020 was personally reviewed today and demonstrated:  Sinus rhythm with PVC.  Recent Labwork: 04/17/2020: BUN 16; Creatinine, Ser 1.02; Hemoglobin 17.6; Platelets 358; Potassium 4.3; Sodium 140  May 2022: Hemoglobin 16.8, platelets 352, BUN 16, creatinine 0.9, potassium 4.3, AST 14, ALT 26, cholesterol 106, triglycerides 144, HDL 28, LDL 53, hemoglobin A1c 6.2%  Other Studies Reviewed Today:  Lexiscan Myoview 07/06/2017: There was no ST segment deviation noted during stress. Findings consistent with prior large inferior myocardial infarction with mild peri-infarct ischemia. This is a low risk study. Minimal myocardium currently at jeopardy. The left ventricular ejection fraction is low normal (50%).   Echocardiogram 04/09/2020:  1. Left ventricular ejection fraction, by estimation, is 50 to 55%. The  left ventricle has low normal function. The left ventricle has no regional  wall motion abnormalities. There is mild left ventricular hypertrophy.  Left ventricular diastolic  parameters are indeterminate.   2. Right ventricular systolic function is normal. The right ventricular  size is normal.   3. The mitral valve is normal in structure. No evidence of mitral valve  regurgitation. No evidence of mitral stenosis.   4. The aortic valve is tricuspid. Aortic valve regurgitation is mild. No  aortic stenosis is present.   5. The inferior vena cava is normal in size with greater than 50%  respiratory variability, suggesting right atrial pressure of 3 mmHg.   Assessment and Plan:  1.  CAD status post DES to circumflex with known occlusion of the mid RCA.  He reports no active angina on medical therapy.  Continue Lipitor, Lopressor, and as needed nitroglycerin.  2.  Persistent atrial fibrillation with CHA2DS2-VASc score of 3.  He is status post atrial fibrillation ablation and has done well.   No active palpitations.  Continue Xarelto for stroke prophylaxis.  Due for follow-up lab work at [facility].  Medication Adjustments/Labs and Tests Ordered: Current medicines are reviewed at length with the patient today.  Concerns regarding medicines are outlined above.   Tests Ordered: No orders of the defined types were placed in this encounter.   Medication Changes: No orders of the defined types were placed in this encounter.   Disposition:  Follow up  6 months.  Signed, Satira Sark, MD, [facility] 04/14/2021 1:24 PM    [facility] [place], [place], [place] [address] Phone: [phone]; Fax: [phone]

## 2021-06-01 ENCOUNTER — Telehealth: Payer: Self-pay | Admitting: Cardiology

## 2021-06-01 MED ORDER — NITROGLYCERIN 0.4 MG SL SUBL: 0.4000 mg | SUBLINGUAL_TABLET | SUBLINGUAL | 3 refills | Status: DC | PRN

## 2021-06-01 NOTE — Telephone Encounter (Signed)
?*  STAT* If patient is at the pharmacy, call can be transferred to refill team. ? ? ?1. Which medications need to be refilled? (please list name of each medication and dose if known) nitroGLYCERIN (NITROSTAT) 0.4 MG SL tablet ? ?2. Which pharmacy/location (including street and city if local pharmacy) is medication to be sent to? [facility] [phone] - [place], [address] ? ?3. Do they need a 30 day or 90 day supply? 30 day  ?

## 2021-06-05 ENCOUNTER — Telehealth: Payer: Self-pay | Admitting: Cardiology

## 2021-06-05 NOTE — Telephone Encounter (Signed)
Wife Novella Olive) notified & verbalized understanding.   ?

## 2021-06-05 NOTE — Telephone Encounter (Signed)
Seems like an excessive hold post procedure. Usually 48hr post procedure is long enough. I will ask Dr. Domenic Polite to give final rec. ?

## 2021-06-05 NOTE — Telephone Encounter (Signed)
Pt was seen by his dermatologist, Dr. Tarri Glenn yesterday and had a place removed from his ear. ? ? Pt was told by Dr. Domenic Polite to stay off of his Xarelto for 48 hrs before but Dr. Tarri Glenn told him not to take it again till Monday.  ? ?Pt would like to know what he should do.  ? ?Please call [phone] ?

## 2021-06-05 NOTE — Telephone Encounter (Signed)
Notified via detailed voice message.  

## 2021-06-05 NOTE — Telephone Encounter (Signed)
Patient's wife is returning call. She states she does not have a voicemail. ?

## 2021-06-22 ENCOUNTER — Other Ambulatory Visit: Payer: Self-pay | Admitting: Cardiology

## 2021-06-22 NOTE — Telephone Encounter (Signed)
Prescription refill request for Xarelto received.  ?Indication: Atrial Fib ?Last office visit: 04/14/21  Myles Gip MD ?Weight: 104.9kg ?Age: 66 ?Scr: 1.00 on 04/20/21 ?CrCl: 106.36 ? ?Based on above findings Xarelto '20mg'$  daily is the appropriate dose.  Refill approved. ? ?

## 2021-08-14 ENCOUNTER — Other Ambulatory Visit: Payer: Self-pay | Admitting: *Deleted

## 2021-08-14 MED ORDER — FUROSEMIDE 20 MG PO TABS: 20.0000 mg | ORAL_TABLET | Freq: Every day | ORAL | 1 refills | Status: DC

## 2021-08-17 ENCOUNTER — Other Ambulatory Visit: Payer: Self-pay

## 2021-08-17 MED ORDER — ATORVASTATIN CALCIUM 80 MG PO TABS: 80.0000 mg | ORAL_TABLET | Freq: Every day | ORAL | 1 refills | Status: AC

## 2021-08-20 DIAGNOSIS — C44629 Squamous cell carcinoma of skin of left upper limb, including shoulder: Secondary | ICD-10-CM | POA: Diagnosis not present

## 2021-09-03 DIAGNOSIS — C44622 Squamous cell carcinoma of skin of right upper limb, including shoulder: Secondary | ICD-10-CM | POA: Diagnosis not present

## 2021-10-08 DIAGNOSIS — H25811 Combined forms of age-related cataract, right eye: Secondary | ICD-10-CM | POA: Diagnosis not present

## 2021-10-08 DIAGNOSIS — H25812 Combined forms of age-related cataract, left eye: Secondary | ICD-10-CM | POA: Diagnosis not present

## 2021-10-15 ENCOUNTER — Encounter: Payer: Self-pay | Admitting: Cardiology

## 2021-10-15 DIAGNOSIS — I480 Paroxysmal atrial fibrillation: Secondary | ICD-10-CM | POA: Diagnosis not present

## 2021-10-15 DIAGNOSIS — R030 Elevated blood-pressure reading, without diagnosis of hypertension: Secondary | ICD-10-CM | POA: Diagnosis not present

## 2021-10-15 DIAGNOSIS — Z1389 Encounter for screening for other disorder: Secondary | ICD-10-CM | POA: Diagnosis not present

## 2021-10-15 DIAGNOSIS — I25118 Atherosclerotic heart disease of native coronary artery with other forms of angina pectoris: Secondary | ICD-10-CM | POA: Diagnosis not present

## 2021-10-15 DIAGNOSIS — R7303 Prediabetes: Secondary | ICD-10-CM | POA: Diagnosis not present

## 2021-10-15 DIAGNOSIS — J449 Chronic obstructive pulmonary disease, unspecified: Secondary | ICD-10-CM | POA: Diagnosis not present

## 2021-10-20 ENCOUNTER — Ambulatory Visit (INDEPENDENT_AMBULATORY_CARE_PROVIDER_SITE_OTHER): Payer: Medicare Other | Admitting: Cardiology

## 2021-10-20 ENCOUNTER — Encounter: Payer: Self-pay | Admitting: Cardiology

## 2021-10-20 VITALS — BP 120/88 | HR 78 | Ht 70.0 in | Wt 231.6 lb

## 2021-10-20 DIAGNOSIS — I25119 Atherosclerotic heart disease of native coronary artery with unspecified angina pectoris: Secondary | ICD-10-CM | POA: Diagnosis not present

## 2021-10-20 DIAGNOSIS — I4819 Other persistent atrial fibrillation: Secondary | ICD-10-CM

## 2021-10-20 NOTE — Patient Instructions (Addendum)

## 2021-10-20 NOTE — Progress Notes (Signed)
Cardiology Office Note  Date: 10/20/2021   ID: [name], [name] 29-Mar-1955, MRN [id number]  PCP:  Jalene Mullet, PA-C  Cardiologist:  Rozann Lesches, MD Electrophysiologist:  Thompson Grayer, MD   Chief Complaint  Patient presents with   Cardiac follow-up    History of Present Illness: [name] is a 66 y.o. male last seen in January.  He is here today with his wife for a follow-up visit.  Reports no active angina or nitroglycerin use.  He has been doing some woodworking and also yard work, stable NYHA class II dyspnea.  He does not report any palpitations to suggest breakthrough atrial fibrillation.  Heart rate up when he came in today with easy ECG showing sinus tachycardia with a ventricular couplet and PVC, nonspecific ST changes.  After a few minutes heart rate down into the 70s and asymptomatic on examination.  I reviewed his cardiac regimen which is outlined below.  He did have recent lab work with PCP, requesting results for review.  He does not report any spontaneous bleeding problems on Xarelto.  Past Medical History:  Diagnosis Date   Asthma    COPD (chronic obstructive pulmonary disease) ([place])    Coronary atherosclerosis of native coronary artery    a. s/p DES to RCA in 2006 b. DES to LCx in 2012 with CTO of RCA noted at that time   Hypertension    Mixed hyperlipidemia    Myocardial infarction [facility])    NSTEMI 4/12   Paroxysmal atrial fibrillation ([facility])    Seasonal allergies    Skin cancer     Past Surgical History:  Procedure Laterality Date   ATRIAL FIBRILLATION ABLATION N/A 05/07/2020   Procedure: ATRIAL FIBRILLATION ABLATION;  Surgeon: Thompson Grayer, MD;  Location: [place] [facility];  Service: Cardiovascular;  Laterality: N/A;   CARDIAC CATHETERIZATION N/A 07/26/2014   Procedure: Left Heart Cath and Coronary Angiography;  Surgeon: Peter M Martinique, MD;  Location: [place] [facility];  Service: Cardiovascular;  Laterality: N/A;   CARDIOVERSION N/A  04/03/2020   Procedure: CARDIOVERSION;  Surgeon: Satira Sark, MD;  Location: [facility];  Service: Cardiovascular;  Laterality: N/A;   CORONARY ANGIOPLASTY     stents    CYSTOSCOPY  01/31/2013   Nasal reconstruction surgery      Current Outpatient Medications  Medication Sig Dispense Refill   acetaminophen (TYLENOL) 500 MG tablet Take 1,000 mg by mouth every 6 (six) hours as needed for moderate pain.     atorvastatin (LIPITOR) 80 MG tablet Take 1 tablet (80 mg total) by mouth daily. 90 tablet 1   cetirizine (ZYRTEC) 10 MG tablet Take 10 mg by mouth daily.     furosemide (LASIX) 20 MG tablet Take 1 tablet (20 mg total) by mouth daily. 90 tablet 1   guaiFENesin (MUCINEX) 600 MG 12 hr tablet Take 600 mg by mouth 2 (two) times daily.      ibuprofen (ADVIL) 200 MG tablet Take 400 mg by mouth every 6 (six) hours as needed for moderate pain.     ipratropium-albuterol (DUONEB) 0.5-2.5 (3) MG/3ML SOLN SMARTSIG:3 Milliliter(s) Via Nebulizer 4 Times Daily PRN     metoprolol tartrate (LOPRESSOR) 25 MG tablet TAKE 1 TABLET(25 MG) BY MOUTH TWICE DAILY 180 tablet 1   Multiple Vitamin (MULTIVITAMIN) tablet Take 1 tablet by mouth daily.     nitroGLYCERIN (NITROSTAT) 0.4 MG SL tablet Place 1 tablet (0.4 mg total) under the tongue every 5 (five) minutes  x 3 doses as needed for chest pain (if no relief after 2nd dose, proceed to ED for evaluation or call 911). 25 tablet 3   rivaroxaban (XARELTO) 20 MG TABS tablet TAKE 1 TABLET BY MOUTH EVERY DAY WITH SUPPER 30 tablet 11   No current facility-administered medications for this visit.   Allergies:  Patient has no known allergies.   ROS: No syncope.  Physical Exam: VS:  BP 120/88   Pulse 78   Ht '5\' 10"'$  (1.778 m)   Wt 231 lb 9.6 oz (105.1 kg)   SpO2 95%   BMI 33.23 kg/m , BMI Body mass index is 33.23 kg/m.  Wt Readings from Last 3 Encounters:  10/20/21 231 lb 9.6 oz (105.1 kg)  04/14/21 231 lb 3.2 oz (104.9 kg)  10/06/20 238 lb (108 kg)     General: Patient appears comfortable at rest. HEENT: Conjunctiva and lids normal. Neck: Supple, no elevated JVP or carotid bruits, no thyromegaly. Lungs: Clear to auscultation, nonlabored breathing at rest. Cardiac: Regular rate and rhythm, no S3 or significant systolic murmur, no pericardial rub. Extremities: No pitting edema.  ECG:  An ECG dated 10/06/2020 was personally reviewed today and demonstrated:  Sinus rhythm with PVC.  Recent Labwork:  May 2022: Hemoglobin 16.8, platelets 352, BUN 16, creatinine 0.9, potassium 4.3, AST 14, ALT 26, cholesterol 106, triglycerides 144, HDL 28, LDL 53, hemoglobin A1c 6.2%  Other Studies Reviewed Today:  Lexiscan Myoview 07/06/2017: There was no ST segment deviation noted during stress. Findings consistent with prior large inferior myocardial infarction with mild peri-infarct ischemia. This is a low risk study. Minimal myocardium currently at jeopardy. The left ventricular ejection fraction is low normal (50%).   Echocardiogram 04/09/2020:  1. Left ventricular ejection fraction, by estimation, is 50 to 55%. The  left ventricle has low normal function. The left ventricle has no regional  wall motion abnormalities. There is mild left ventricular hypertrophy.  Left ventricular diastolic  parameters are indeterminate.   2. Right ventricular systolic function is normal. The right ventricular  size is normal.   3. The mitral valve is normal in structure. No evidence of mitral valve  regurgitation. No evidence of mitral stenosis.   4. The aortic valve is tricuspid. Aortic valve regurgitation is mild. No  aortic stenosis is present.   5. The inferior vena cava is normal in size with greater than 50%  respiratory variability, suggesting right atrial pressure of 3 mmHg.   Assessment and Plan:  1.  CAD status post DES to the circumflex with occlusion of the mid RCA managed medically.  No active angina at this time and stable on medical therapy  including Lopressor, Lipitor, and as needed nitroglycerin.  2.  Paroxysmal to persistent atrial fibrillation with CHA2DS2-VASc score of 3.  He is status post atrial fibrillation ablation and has had no substantial recurrences.  Continue Xarelto for stroke prophylaxis, tolerating well.  Requesting interval lab work for review.  Medication Adjustments/Labs and Tests Ordered: Current medicines are reviewed at length with the patient today.  Concerns regarding medicines are outlined above.   Tests Ordered: Orders Placed This Encounter  Procedures   EKG 12-Lead    Medication Changes: No orders of the defined types were placed in this encounter.   Disposition:  Follow up  6 months.  Signed, Satira Sark, MD, [facility] 10/20/2021 2:22 PM    [facility] [place], [place],  [address] Phone: [phone]; Fax: [phone])  [phone]  

## 2021-10-27 ENCOUNTER — Telehealth: Payer: Self-pay | Admitting: Cardiology

## 2021-10-27 MED ORDER — RIVAROXABAN 20 MG PO TABS: 20.0000 mg | ORAL_TABLET | Freq: Every day | ORAL | 0 refills | Status: DC

## 2021-10-27 NOTE — Telephone Encounter (Signed)
Wife Novella Olive) notified - samples ready for pick up.  Will put assistance application in bag as well.   $149.00 per month now.

## 2021-10-27 NOTE — Telephone Encounter (Signed)
Pt has went into the Medicare gap this month and is almost out of his Xarelto - would like to know if he can get some samples?  Please give pt a call

## 2021-10-29 DIAGNOSIS — H25812 Combined forms of age-related cataract, left eye: Secondary | ICD-10-CM | POA: Diagnosis not present

## 2021-10-29 DIAGNOSIS — H269 Unspecified cataract: Secondary | ICD-10-CM | POA: Diagnosis not present

## 2021-10-29 DIAGNOSIS — Z01818 Encounter for other preprocedural examination: Secondary | ICD-10-CM | POA: Diagnosis not present

## 2021-11-02 ENCOUNTER — Other Ambulatory Visit: Payer: Self-pay | Admitting: Cardiology

## 2021-11-19 DIAGNOSIS — H269 Unspecified cataract: Secondary | ICD-10-CM | POA: Diagnosis not present

## 2021-11-19 DIAGNOSIS — H25811 Combined forms of age-related cataract, right eye: Secondary | ICD-10-CM | POA: Diagnosis not present

## 2021-12-02 DIAGNOSIS — H04123 Dry eye syndrome of bilateral lacrimal glands: Secondary | ICD-10-CM | POA: Diagnosis not present

## 2021-12-25 ENCOUNTER — Encounter: Payer: Self-pay | Admitting: *Deleted

## 2021-12-25 ENCOUNTER — Telehealth: Payer: Self-pay | Admitting: Cardiology

## 2021-12-25 MED ORDER — RIVAROXABAN 20 MG PO TABS: 20.0000 mg | ORAL_TABLET | Freq: Every day | ORAL | 0 refills | Status: DC

## 2021-12-25 NOTE — Telephone Encounter (Signed)
  Patient calling the office for samples of medication:   1.  What medication and dosage are you requesting samples for? rivaroxaban (XARELTO) 20 MG TABS tablet  2.  Are you currently out of this medication? Yes  Pt is in donut hole and it will cost them $150 for xarelto

## 2021-12-25 NOTE — Telephone Encounter (Signed)
Samples provided and patient's wife aware to come to the office on Monday to pick them up. Does not qualify for patient assistance due to income

## 2021-12-25 NOTE — Telephone Encounter (Signed)
Xarelto '20mg'$  sample request received. Pt is 66 years old, weight-105.1kg, Crea-1.00 on 04/20/2021 via [employer], last seen by Dr Domenic Polite on 10/20/21, Diagnosis-Afib, CrCl-106.56 mL/min; Dose is appropriate based on dosing criteria.   Will send back to satellite location to address samples if any available, etc.

## 2022-01-06 DIAGNOSIS — D485 Neoplasm of uncertain behavior of skin: Secondary | ICD-10-CM | POA: Diagnosis not present

## 2022-01-06 DIAGNOSIS — C44629 Squamous cell carcinoma of skin of left upper limb, including shoulder: Secondary | ICD-10-CM | POA: Diagnosis not present

## 2022-01-06 DIAGNOSIS — L570 Actinic keratosis: Secondary | ICD-10-CM | POA: Diagnosis not present

## 2022-01-21 DIAGNOSIS — L905 Scar conditions and fibrosis of skin: Secondary | ICD-10-CM | POA: Diagnosis not present

## 2022-01-21 DIAGNOSIS — C44629 Squamous cell carcinoma of skin of left upper limb, including shoulder: Secondary | ICD-10-CM | POA: Diagnosis not present

## 2022-02-17 ENCOUNTER — Telehealth: Payer: Self-pay | Admitting: Cardiology

## 2022-02-17 MED ORDER — RIVAROXABAN 20 MG PO TABS: 20.0000 mg | ORAL_TABLET | Freq: Every day | ORAL | 0 refills | Status: DC

## 2022-02-17 NOTE — Telephone Encounter (Signed)
Advised that xarelto samples are available for pick up.

## 2022-02-17 NOTE — Telephone Encounter (Signed)
Patient calling the office for samples of medication:   1.  What medication and dosage are you requesting samples for? rivaroxaban (XARELTO) 20 MG TABS tablet   2.  Are you currently out of this medication? Patient's wife wants to know if he can have three bottles of it that would get him out of the donut hole.

## 2022-02-22 ENCOUNTER — Other Ambulatory Visit: Payer: Self-pay | Admitting: Cardiology

## 2022-03-18 DIAGNOSIS — I480 Paroxysmal atrial fibrillation: Secondary | ICD-10-CM | POA: Diagnosis not present

## 2022-03-18 DIAGNOSIS — J449 Chronic obstructive pulmonary disease, unspecified: Secondary | ICD-10-CM | POA: Diagnosis not present

## 2022-03-18 DIAGNOSIS — R030 Elevated blood-pressure reading, without diagnosis of hypertension: Secondary | ICD-10-CM | POA: Diagnosis not present

## 2022-03-18 DIAGNOSIS — I25118 Atherosclerotic heart disease of native coronary artery with other forms of angina pectoris: Secondary | ICD-10-CM | POA: Diagnosis not present

## 2022-03-18 DIAGNOSIS — R7303 Prediabetes: Secondary | ICD-10-CM | POA: Diagnosis not present

## 2022-04-19 ENCOUNTER — Other Ambulatory Visit: Payer: Self-pay | Admitting: Cardiology

## 2022-05-11 NOTE — Progress Notes (Unsigned)
    Cardiology Office Note  Date: 05/12/2022   ID: [name], [name] 1955-11-16, MRN [id number]  History of Present Illness: [name] is a 67 y.o. male last seen in August 2023.  He is here with his wife today for a routine visit.  Reports no angina or nitroglycerin use, no palpitations or increasing shortness of breath with typical activities.  I reviewed his medications.  He continues on Lipitor 80 mg daily with recent LDL 51.  Also on Xarelto for stroke prophylaxis, no spontaneous bleeding problems reported.  He remains on low-dose Lopressor and Lasix as well.  Reports no orthopnea or PND, no significant leg swelling.  Physical Exam: VS:  BP 122/80   Pulse 73   Ht 5' 10"$  (1.778 m)   Wt 239 lb 6.4 oz (108.6 kg)   SpO2 92%   BMI 34.35 kg/m , BMI Body mass index is 34.35 kg/m.  Wt Readings from Last 3 Encounters:  05/12/22 239 lb 6.4 oz (108.6 kg)  10/20/21 231 lb 9.6 oz (105.1 kg)  04/14/21 231 lb 3.2 oz (104.9 kg)    General: Patient appears comfortable at rest. HEENT: Conjunctiva and lids normal. Neck: Supple, no elevated JVP or carotid bruits. Lungs: Clear to auscultation, nonlabored breathing at rest. Cardiac: Regular rate and rhythm, no S3 or significant systolic murmur.  ECG:  An ECG dated 10/20/2021 was personally reviewed today and demonstrated:  Sinus tachycardia with ventricular couplet and PVC, nonspecific ST changes.  Labwork:  January 2024: Hemoglobin 18, platelets 330, BUN 9, creatinine 0.87, potassium 4.9, AST 19, ALT 30, cholesterol 105, triglycerides 117, HDL 33, LDL 51, hemoglobin A1c 6%  Other Studies Reviewed Today:  No interval cardiac testing for review today.  Assessment and Plan:  1.  Paroxysmal to persistent atrial fibrillation with CHA2DS2-VASc score of 3 status post atrial fibrillation ablation in 2022.  He continues to do well and remains on Xarelto for stroke prophylaxis.  I reviewed his recent lab work from January.  Hemoglobin  18 and creatinine 0.87.  He does not report any spontaneous bleeding problems, no significant palpitations.  Continue present regimen which also includes Lopressor.  2.  CAD status post DES to the circumflex with known occlusion of the mid RCA managed medically.  No angina or nitroglycerin use.  Last formal ischemic testing was via Myoview in 2019 showing evidence of old inferior infarct scar and mild peri-infarct ischemia.  LVEF 50 to 55% by echocardiogram in 2022.  He is not on aspirin given use of Xarelto.  Continue Lipitor, recent LDL 51.  Disposition:  Follow up  6 months.  Signed, Satira Sark, M.D., F.A.C.C.

## 2022-05-12 ENCOUNTER — Encounter: Payer: Self-pay | Admitting: Cardiology

## 2022-05-12 ENCOUNTER — Ambulatory Visit: Payer: Medicare Other | Attending: Cardiology | Admitting: Cardiology

## 2022-05-12 VITALS — BP 122/80 | HR 73 | Ht 70.0 in | Wt 239.4 lb

## 2022-05-12 DIAGNOSIS — I4819 Other persistent atrial fibrillation: Secondary | ICD-10-CM | POA: Diagnosis not present

## 2022-05-12 DIAGNOSIS — I25119 Atherosclerotic heart disease of native coronary artery with unspecified angina pectoris: Secondary | ICD-10-CM | POA: Diagnosis not present

## 2022-05-12 NOTE — Patient Instructions (Addendum)

## 2022-07-16 ENCOUNTER — Other Ambulatory Visit: Payer: Self-pay | Admitting: Cardiology

## 2022-07-16 DIAGNOSIS — H5712 Ocular pain, left eye: Secondary | ICD-10-CM | POA: Diagnosis not present

## 2022-07-16 DIAGNOSIS — H109 Unspecified conjunctivitis: Secondary | ICD-10-CM | POA: Diagnosis not present

## 2022-07-19 NOTE — Telephone Encounter (Signed)
Prescription refill request for Xarelto received.  Indication: PAF Last office visit:  05/12/22  Ival Bible MD Weight: 108.6kg Age: 67 Scr: 0.87 on 03/18/22  Epic CrCl: 124.83  Based on above findings Xarelto 20mg  daily is the appropriate dose.  Refill approved.

## 2022-07-30 DIAGNOSIS — S86912A Strain of unspecified muscle(s) and tendon(s) at lower leg level, left leg, initial encounter: Secondary | ICD-10-CM | POA: Diagnosis not present

## 2022-08-02 ENCOUNTER — Telehealth: Payer: Self-pay | Admitting: Cardiology

## 2022-08-02 DIAGNOSIS — S86912A Strain of unspecified muscle(s) and tendon(s) at lower leg level, left leg, initial encounter: Secondary | ICD-10-CM | POA: Diagnosis not present

## 2022-08-02 DIAGNOSIS — M79605 Pain in left leg: Secondary | ICD-10-CM | POA: Diagnosis not present

## 2022-08-02 NOTE — Telephone Encounter (Signed)
Reports left lower leg pain from knee to ankle that started last Thursday, 07/29/2022. Denies redness, swelling or increased warmth in left leg. Saw PCP on Friday and was given tramadol and methocarbamol. Reports ibuprofen, tylenol or tramadol doesn't give relief. Advised that he needed to contact PCP for re-evaluation and says he has appointment with PCP today at 6:45 pm. Advised if PCP thinks this is a cardiac problem, to call office back or he could see care at Urgent Care if needed. Verbalized understanding.

## 2022-08-02 NOTE — Telephone Encounter (Signed)
Pt has been having left leg pain from his knee to his ankle-  Went to see his PCP on Friday and they did not think it would be a blood clot due to pt being on blood thinners.  They gave pt medication but it has not helped. He is very concerned and worried that it could be a clot.   Please give pt a call @ [phone]

## 2022-08-11 IMAGING — CT CT HEART MORPH/PULM VEIN W/ CM & W/O CA SCORE
1 of 5 series · 8 of 20 positions shown, 10 images · non-contrast
Comparison: None.
COMPARISON: None.

Addendum:
EXAM:
OVER-READ INTERPRETATION  CT CHEST

The following report is an over-read performed by radiologist Dr.
Afonja Joanna [REDACTED] on 04/30/2020. This
over-read does not include interpretation of cardiac or coronary
anatomy or pathology. The coronary calcium score/coronary CTA
interpretation by the cardiologist is attached.
CLINICAL DATA: Atrial fibrillation
Cardiac CTA
MEDICATIONS:
No medications.
TECHNIQUE: The patient was scanned on a Siemens [REDACTED]ice scanner. Gantry
rotation speed was 250 msecs. Collimation was 0.6 mm. A 100 kV
prospective scan was triggered in the ascending thoracic aorta at
35-75% of the R-R interval. Average HR during the scan was 70 bpm.
The 3D data set was interpreted on a dedicated work station using
MPR, MIP and VRT modes. A total of 80cc of contrast was used.

[Series 6: best syst · axial · 0.44mm/px · z∈[+1290,+1396]mm · 8 of 344 slices shown, 10 images]
[im 39/344  vessel]
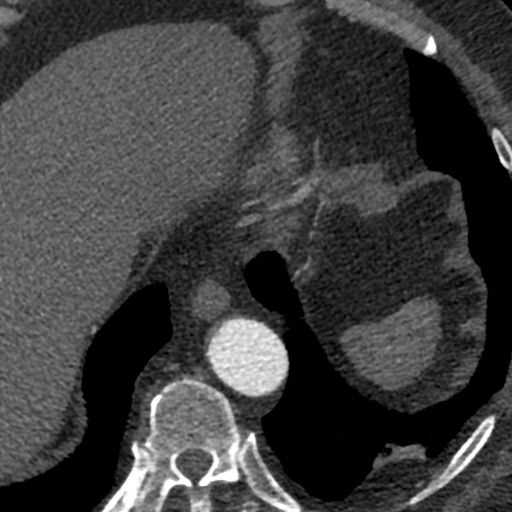
[im 39/344  lung]
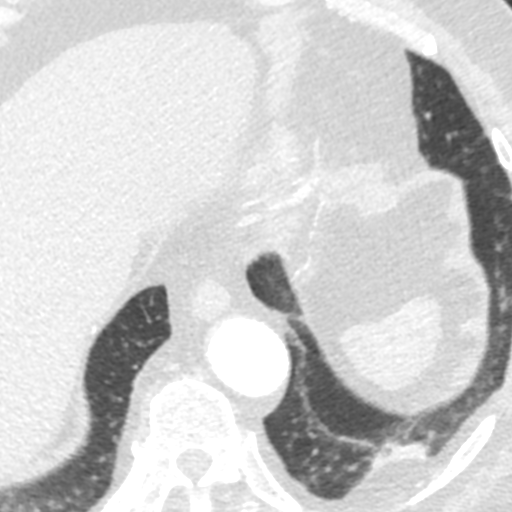
[im 77/344  vessel]
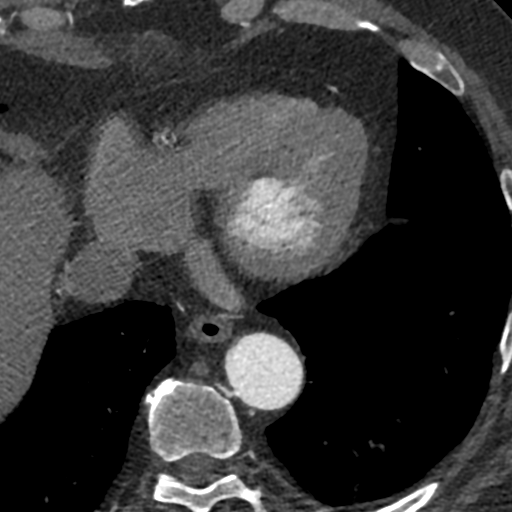
[im 115/344  vessel]
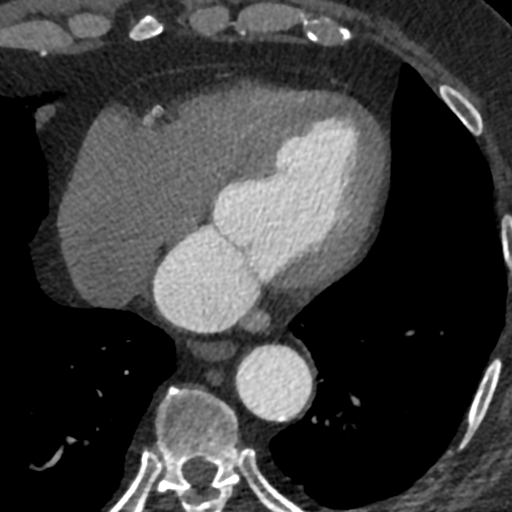
[im 153/344  vessel]
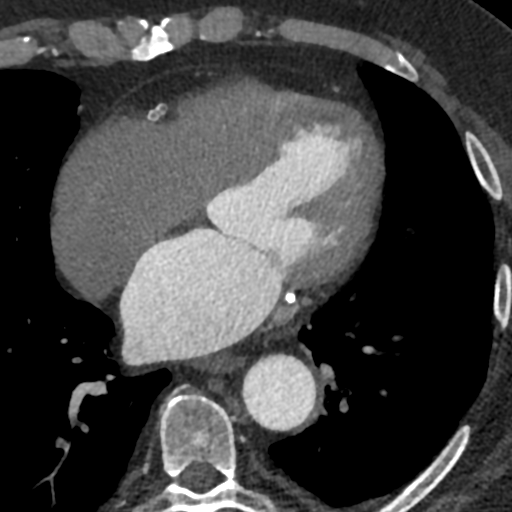
[im 191/344  vessel]
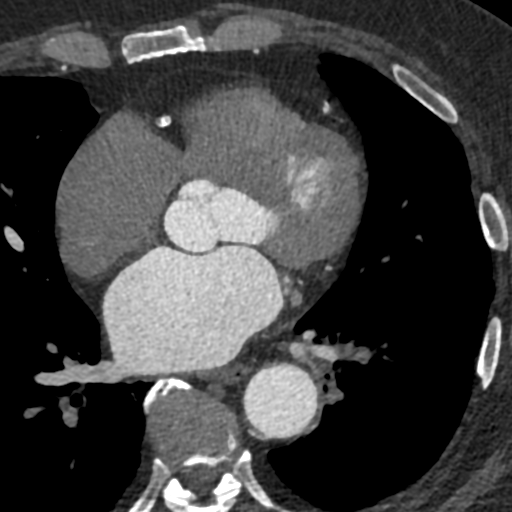
[im 191/344  lung]
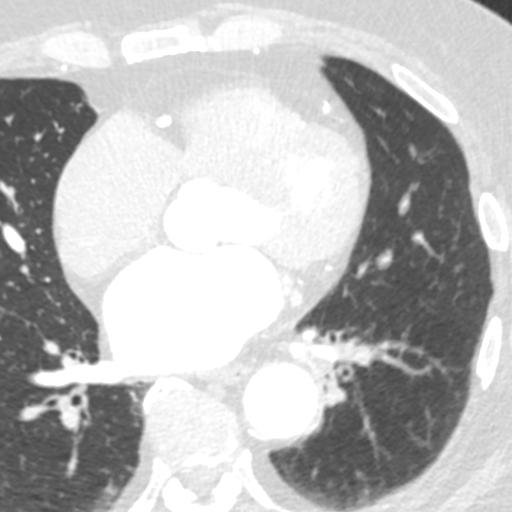
[im 229/344  vessel]
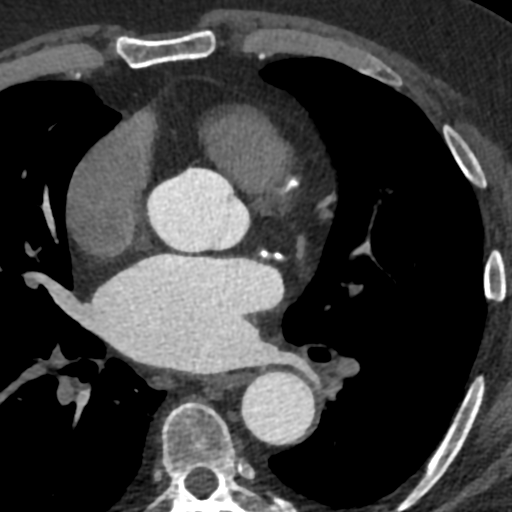
[im 267/344  vessel]
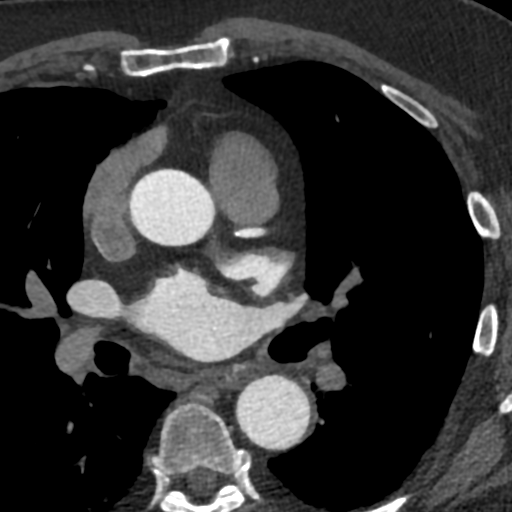
[im 305/344  vessel]
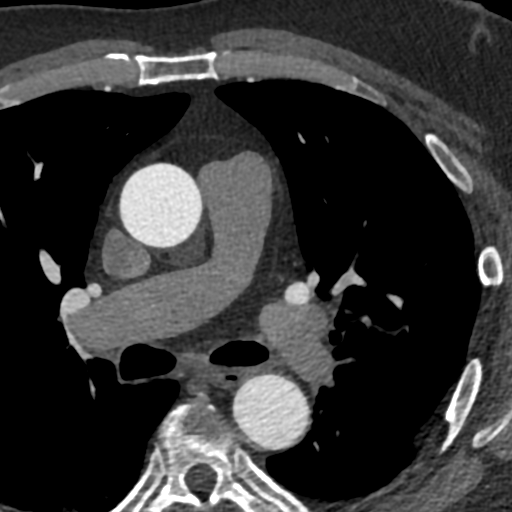

[8 of 20 positions shown; findings below may reference images not displayed]

FINDINGS: Aortic atherosclerosis. Within the visualized portions of the thorax
there are no suspicious appearing pulmonary nodules or masses, there
is no acute consolidative airspace disease, no pleural effusions, no
pneumothorax and no lymphadenopathy. Visualized portions of the
upper abdomen are unremarkable. There are no aggressive appearing
lytic or blastic lesions noted in the visualized portions of the
skeleton.
IMPRESSION: 1.  Aortic Atherosclerosis (573XP-LR4.4).
FINDINGS: Non-cardiac: See separate report from [REDACTED].

Opacification noted in the distal LA appendage on initial contrast
images. However, delayed images show no thrombus present in the LA
appendage.

Pulmonary veins drain normally to the left atrium.

LSPV 18 x 16 mm, 2.37 cm^2

LIPV 18 x 16 mm, 2.15 cm^2 (close proximity to descending thoracic
aorta).

RSPV 17 x 17 mm, 2.18 cm^2

RMPV 9 x 9 mm, 0.63 cm^2

RIPV 17 x 16 mm, 2.03 cm^2

Coronary Arteries: Right dominant with no anomalies

LM: No left main present, LAD and LCx appear to have separate but
close in proximity ostia off the left cusp.

LAD system: Mixed plaque proximal LAD, possible moderate (51-69%)
stenosis. Motion artifact obscures the mid to distal LAD.

Circumflex system: There is a mid-LCx stent present. Motion artifact
obscures the LCx, difficult to comment on stenosis.

RCA system: There are stents in the proximal and mid RCA. The RCA is
totally occluded starting in the proximal stent.
IMPRESSION: 1.  No LA appendage thrombus noted.

2.  Pulmonary veins as noted above.

3. This study was not protocoled to assess the coronaries though
there is significant disease present.

Quirijn Amazigh

*** End of Addendum ***
EXAM:
OVER-READ INTERPRETATION  CT CHEST

The following report is an over-read performed by radiologist Dr.
Afonja Joanna [REDACTED] on 04/30/2020. This
over-read does not include interpretation of cardiac or coronary
anatomy or pathology. The coronary calcium score/coronary CTA
interpretation by the cardiologist is attached.
FINDINGS: Aortic atherosclerosis. Within the visualized portions of the thorax
there are no suspicious appearing pulmonary nodules or masses, there
is no acute consolidative airspace disease, no pleural effusions, no
pneumothorax and no lymphadenopathy. Visualized portions of the
upper abdomen are unremarkable. There are no aggressive appearing
lytic or blastic lesions noted in the visualized portions of the
skeleton.
IMPRESSION: 1.  Aortic Atherosclerosis (573XP-LR4.4).

## 2022-09-06 DIAGNOSIS — R739 Hyperglycemia, unspecified: Secondary | ICD-10-CM | POA: Diagnosis not present

## 2022-09-06 DIAGNOSIS — N200 Calculus of kidney: Secondary | ICD-10-CM | POA: Diagnosis not present

## 2022-09-06 DIAGNOSIS — Z0001 Encounter for general adult medical examination with abnormal findings: Secondary | ICD-10-CM | POA: Diagnosis not present

## 2022-09-14 DIAGNOSIS — Z23 Encounter for immunization: Secondary | ICD-10-CM | POA: Diagnosis not present

## 2022-09-14 DIAGNOSIS — I480 Paroxysmal atrial fibrillation: Secondary | ICD-10-CM | POA: Diagnosis not present

## 2022-09-14 DIAGNOSIS — R7303 Prediabetes: Secondary | ICD-10-CM | POA: Diagnosis not present

## 2022-09-14 DIAGNOSIS — R030 Elevated blood-pressure reading, without diagnosis of hypertension: Secondary | ICD-10-CM | POA: Diagnosis not present

## 2022-09-14 DIAGNOSIS — Z0001 Encounter for general adult medical examination with abnormal findings: Secondary | ICD-10-CM | POA: Diagnosis not present

## 2022-09-14 DIAGNOSIS — J449 Chronic obstructive pulmonary disease, unspecified: Secondary | ICD-10-CM | POA: Diagnosis not present

## 2022-09-14 DIAGNOSIS — I25118 Atherosclerotic heart disease of native coronary artery with other forms of angina pectoris: Secondary | ICD-10-CM | POA: Diagnosis not present

## 2022-11-04 ENCOUNTER — Other Ambulatory Visit: Payer: Self-pay | Admitting: Cardiology

## 2022-11-17 ENCOUNTER — Ambulatory Visit: Payer: Medicare Other | Admitting: Cardiology

## 2022-11-19 ENCOUNTER — Telehealth: Payer: Self-pay | Admitting: Cardiology

## 2022-11-19 MED ORDER — RIVAROXABAN 20 MG PO TABS: 20.0000 mg | ORAL_TABLET | Freq: Every day | ORAL | Status: DC

## 2022-11-19 NOTE — Telephone Encounter (Signed)
Patient has hit the doughnut hole until the beginning of the year price of Rx went from 47$ a month to 187$ a month until January 1 month supply given today

## 2022-11-19 NOTE — Telephone Encounter (Signed)
Patient calling the office for samples of medication:   1.  What medication and dosage are you requesting samples for? rivaroxaban (XARELTO) 20 MG TABS tablet  2.  Are you currently out of this medication? Yes   

## 2023-02-07 ENCOUNTER — Telehealth: Payer: Self-pay | Admitting: Cardiology

## 2023-02-07 MED ORDER — RIVAROXABAN 20 MG PO TABS: 20.0000 mg | ORAL_TABLET | Freq: Every day | ORAL | 0 refills | Status: DC

## 2023-02-07 NOTE — Telephone Encounter (Signed)
Patient calling the office for samples of medication:   1.  What medication and dosage are you requesting samples for?   rivaroxaban (XARELTO) 20 MG TABS tablet    2.  Are you currently out of this medication?   Has 3 left   Needs enough samples to get him through the rest of the year.

## 2023-02-07 NOTE — Telephone Encounter (Signed)
Wife, Gerre Pebbles informed that samples of xarelto 20 mg are available for pick up.

## 2023-02-07 NOTE — Telephone Encounter (Signed)
Sample request for Xarelto received.  Indication: PAF Last office visit: 05/12/22  Ival Bible MD Weight: 108.6kg Age: 67 Scr: 0.87 in 03/18/22  [facility] CrCl: 124.83  Based on above findings Xarelto 20mg  daily is the appropriate dose.  OK to provide samples if available.

## 2023-03-21 ENCOUNTER — Ambulatory Visit: Payer: Medicare Other | Attending: Cardiology | Admitting: Cardiology

## 2023-03-21 ENCOUNTER — Encounter: Payer: Self-pay | Admitting: Cardiology

## 2023-03-21 VITALS — BP 134/88 | HR 77 | Ht 70.0 in | Wt 238.9 lb

## 2023-03-21 DIAGNOSIS — I10 Essential (primary) hypertension: Secondary | ICD-10-CM | POA: Diagnosis not present

## 2023-03-21 DIAGNOSIS — E782 Mixed hyperlipidemia: Secondary | ICD-10-CM

## 2023-03-21 DIAGNOSIS — I25119 Atherosclerotic heart disease of native coronary artery with unspecified angina pectoris: Secondary | ICD-10-CM

## 2023-03-21 DIAGNOSIS — I4819 Other persistent atrial fibrillation: Secondary | ICD-10-CM | POA: Diagnosis not present

## 2023-03-21 NOTE — Progress Notes (Signed)
    Cardiology Office Note  Date: 03/21/2023   ID: Edward, Patton December 07, 1955, MRN [id number]  History of Present Illness: [name] is a 68 y.o. male last seen in February 2024.  He is here with his wife for a follow-up visit.  Reports no angina or nitroglycerin  use in the interim, no change in stamina, no palpitations.  His wife had lung cancer surgery in the interim, doing well at this point.  I reviewed his medications.  Current cardiovascular regimen includes Xarelto , Lopressor , Lipitor, Lasix , and as needed nitroglycerin .  He does not report any spontaneous bleeding problems.  Cost of Xarelto  has gone up significantly and he is looking at other options.  I reviewed his ECG today which shows sinus rhythm with PVCs, old inferior infarct pattern.  He is due for follow-up lab work with PCP this month.  Physical Exam: VS:  BP 134/88   Pulse 77   Ht 5' 10 (1.778 m)   Wt 238 lb 14.4 oz (108.4 kg)   SpO2 94%   BMI 34.28 kg/m , BMI Body mass index is 34.28 kg/m.  Wt Readings from Last 3 Encounters:  03/21/23 238 lb 14.4 oz (108.4 kg)  05/12/22 239 lb 6.4 oz (108.6 kg)  10/20/21 231 lb 9.6 oz (105.1 kg)    General: Patient appears comfortable at rest. HEENT: Conjunctiva and lids normal. Neck: Supple, no elevated JVP or carotid bruits. Lungs: Clear to auscultation, nonlabored breathing at rest. Cardiac: Regular rate and rhythm, no S3 or significant systolic murmur.  ECG:  An ECG dated 10/20/2021 was personally reviewed today and demonstrated:  Sinus tachycardia with ventricular couplet and PVC, nonspecific ST changes.  Labwork:  January 2024: Hemoglobin 18, platelets 330, BUN 9, creatinine 0.87, potassium 4.9, AST 19, ALT 30, cholesterol 105, triglycerides 117, HDL 33, LDL 51, hemoglobin A1c 6%   Other Studies Reviewed Today:  No interval cardiac testing for review today.  Assessment and Plan:  1.  Paroxysmal to persistent atrial fibrillation with CHA2DS2-VASc  score of 3 status post atrial fibrillation ablation in 2022.  He has done well without significant palpitations and at this point continues on Xarelto  for stroke prophylaxis.  We are looking into cost of Eliquis and other coverage options.  Continue Lopressor  at current dose.   2.  CAD status post DES to the RCA in 2006 and DES to the circumflex in 2012.  Follow-up angiography in 2012 showed CTO of the mid RCA managed medically.  No interval angina or nitroglycerin  use.  Continue Lipitor.  3.  Mixed hyperlipidemia.  Last LDL 51 on Lipitor.  4.  Primary hypertension.  Blood pressure reasonable today, no changes were made.  Disposition:  Follow up  6 months.  Signed, Jayson JUDITHANN Sierras, M.D., F.A.C.C. [facility]

## 2023-03-21 NOTE — Patient Instructions (Addendum)
 Medication Instructions:  Your physician recommends that you continue on your current medications as directed. Please refer to the Current Medication list given to you today. Cost for eliquis 5 mg is $302 per month  Labwork: none  Testing/Procedures: none  Follow-Up: Your physician recommends that you schedule a follow-up appointment in: 6 months  Any Other Special Instructions Will Be Listed Below (If Applicable).  If you need a refill on your cardiac medications before your next appointment, please call your pharmacy.

## 2023-03-24 DIAGNOSIS — R739 Hyperglycemia, unspecified: Secondary | ICD-10-CM | POA: Diagnosis not present

## 2023-03-24 DIAGNOSIS — J441 Chronic obstructive pulmonary disease with (acute) exacerbation: Secondary | ICD-10-CM | POA: Diagnosis not present

## 2023-03-24 DIAGNOSIS — F17210 Nicotine dependence, cigarettes, uncomplicated: Secondary | ICD-10-CM | POA: Diagnosis not present

## 2023-03-24 DIAGNOSIS — R7303 Prediabetes: Secondary | ICD-10-CM | POA: Diagnosis not present

## 2023-03-31 DIAGNOSIS — R7303 Prediabetes: Secondary | ICD-10-CM | POA: Diagnosis not present

## 2023-03-31 DIAGNOSIS — J449 Chronic obstructive pulmonary disease, unspecified: Secondary | ICD-10-CM | POA: Diagnosis not present

## 2023-03-31 DIAGNOSIS — I10 Essential (primary) hypertension: Secondary | ICD-10-CM | POA: Diagnosis not present

## 2023-03-31 DIAGNOSIS — I480 Paroxysmal atrial fibrillation: Secondary | ICD-10-CM | POA: Diagnosis not present

## 2023-04-06 ENCOUNTER — Encounter: Payer: Self-pay | Admitting: Oncology

## 2023-04-06 ENCOUNTER — Inpatient Hospital Stay: Payer: Medicare Other | Attending: Oncology | Admitting: Oncology

## 2023-04-06 ENCOUNTER — Inpatient Hospital Stay: Payer: Medicare Other

## 2023-04-06 ENCOUNTER — Other Ambulatory Visit: Payer: Self-pay

## 2023-04-06 VITALS — BP 131/92 | HR 71 | Temp 97.1°F | Resp 18 | Ht 70.0 in | Wt 239.8 lb

## 2023-04-06 DIAGNOSIS — J449 Chronic obstructive pulmonary disease, unspecified: Secondary | ICD-10-CM | POA: Diagnosis not present

## 2023-04-06 DIAGNOSIS — D72825 Bandemia: Secondary | ICD-10-CM

## 2023-04-06 DIAGNOSIS — Z7901 Long term (current) use of anticoagulants: Secondary | ICD-10-CM | POA: Diagnosis not present

## 2023-04-06 DIAGNOSIS — I2510 Atherosclerotic heart disease of native coronary artery without angina pectoris: Secondary | ICD-10-CM | POA: Diagnosis not present

## 2023-04-06 DIAGNOSIS — D72829 Elevated white blood cell count, unspecified: Secondary | ICD-10-CM | POA: Insufficient documentation

## 2023-04-06 DIAGNOSIS — Z87891 Personal history of nicotine dependence: Secondary | ICD-10-CM | POA: Insufficient documentation

## 2023-04-06 DIAGNOSIS — I4891 Unspecified atrial fibrillation: Secondary | ICD-10-CM | POA: Diagnosis not present

## 2023-04-06 DIAGNOSIS — Z79899 Other long term (current) drug therapy: Secondary | ICD-10-CM | POA: Insufficient documentation

## 2023-04-06 LAB — CBC WITH DIFFERENTIAL/PLATELET
Abs Immature Granulocytes: 0.05 10*3/uL (ref 0.00–0.07)
Basophils Absolute: 0.1 10*3/uL (ref 0.0–0.1)
Basophils Relative: 0 %
Eosinophils Absolute: 0.3 10*3/uL (ref 0.0–0.5)
Eosinophils Relative: 2 %
HCT: 52.1 % — ABNORMAL HIGH (ref 39.0–52.0)
Hemoglobin: 17.3 g/dL — ABNORMAL HIGH (ref 13.0–17.0)
Immature Granulocytes: 0 %
Lymphocytes Relative: 24 %
Lymphs Abs: 2.9 10*3/uL (ref 0.7–4.0)
MCH: 30.6 pg (ref 26.0–34.0)
MCHC: 33.2 g/dL (ref 30.0–36.0)
MCV: 92.2 fL (ref 80.0–100.0)
Monocytes Absolute: 0.7 10*3/uL (ref 0.1–1.0)
Monocytes Relative: 6 %
Neutro Abs: 8.1 10*3/uL — ABNORMAL HIGH (ref 1.7–7.7)
Neutrophils Relative %: 68 %
Platelets: 318 10*3/uL (ref 150–400)
RBC: 5.65 MIL/uL (ref 4.22–5.81)
RDW: 13.3 % (ref 11.5–15.5)
WBC: 12.1 10*3/uL — ABNORMAL HIGH (ref 4.0–10.5)
nRBC: 0 % (ref 0.0–0.2)

## 2023-04-06 LAB — COMPREHENSIVE METABOLIC PANEL
ALT: 31 U/L (ref 0–44)
AST: 19 U/L (ref 15–41)
Albumin: 4 g/dL (ref 3.5–5.0)
Alkaline Phosphatase: 86 U/L (ref 38–126)
Anion gap: 8 (ref 5–15)
BUN: 18 mg/dL (ref 8–23)
CO2: 26 mmol/L (ref 22–32)
Calcium: 8.9 mg/dL (ref 8.9–10.3)
Chloride: 106 mmol/L (ref 98–111)
Creatinine, Ser: 0.79 mg/dL (ref 0.61–1.24)
GFR, Estimated: >60 mL/min (ref >60)
Glucose, Bld: 121 mg/dL — ABNORMAL HIGH (ref 70–99)
Potassium: 4 mmol/L (ref 3.5–5.1)
Sodium: 140 mmol/L (ref 135–145)
Total Bilirubin: 0.7 mg/dL (ref 0.0–1.2)
Total Protein: 7.5 g/dL (ref 6.5–8.1)

## 2023-04-06 LAB — LACTATE DEHYDROGENASE: LDH: 124 U/L (ref 98–192)

## 2023-04-06 LAB — URIC ACID: Uric Acid, Serum: 4.6 mg/dL (ref 3.7–8.6)

## 2023-04-06 NOTE — Progress Notes (Signed)
[facility] HEMATOLOGY NEW VISIT  Leavy Cella, Amy H, PA-C  REASON FOR REFERRAL: Leukocytosis  HISTORY OF PRESENT ILLNESS: [name] 68 y.o. male referred for leukocytosis.  He is accompanied by his wife today.  He has a history of COPD, A-fib and coronary artery disease. The patient denies any symptoms of infection or inflammation such as fever, night sweats, or weight loss. He has a history of smoking, but quit many years ago.He also has arthritis and has had multiple skin cancers removed. He reports no complaints today and stated that he has been doing very well except for his COPD and A-fib.He denies any family history of blood cancers. The patient worked in [employer] for many years, during which he was exposed to dust and potentially harmful substances. He has been retired for some time.  I have reviewed the past medical history, past surgical history, social history and family history with the patient   ALLERGIES:  has no known allergies.  MEDICATIONS:  Current Outpatient Medications  Medication Sig Dispense Refill   acetaminophen (TYLENOL) 500 MG tablet Take 1,000 mg by mouth every 6 (six) hours as needed for moderate pain.     atorvastatin (LIPITOR) 80 MG tablet Take 1 tablet (80 mg total) by mouth daily. 90 tablet 1   cetirizine (ZYRTEC) 10 MG tablet Take 10 mg by mouth daily.     furosemide (LASIX) 20 MG tablet TAKE 1 TABLET(20 MG) BY MOUTH DAILY 90 tablet 1   guaiFENesin (MUCINEX) 600 MG 12 hr tablet Take 600 mg by mouth 2 (two) times daily.      ibuprofen (ADVIL) 200 MG tablet Take 400 mg by mouth every 6 (six) hours as needed for moderate pain.     ipratropium-albuterol (DUONEB) 0.5-2.5 (3) MG/3ML SOLN SMARTSIG:3 Milliliter(s) Via Nebulizer 4 Times Daily PRN     metoprolol tartrate (LOPRESSOR) 25 MG tablet TAKE 1 TABLET(25 MG) BY MOUTH TWICE DAILY 180 tablet 1   Multiple Vitamin (MULTIVITAMIN) tablet Take 1 tablet by mouth  daily.     nitroGLYCERIN (NITROSTAT) 0.4 MG SL tablet Place 1 tablet (0.4 mg total) under the tongue every 5 (five) minutes x 3 doses as needed for chest pain (if no relief after 2nd dose, proceed to ED for evaluation or call 911). 25 tablet 3   rivaroxaban (XARELTO) 20 MG TABS tablet Take 1 tablet (20 mg total) by mouth daily with supper. 28 tablet 0   No current facility-administered medications for this visit.     REVIEW OF SYSTEMS:   Constitutional: Denies fevers, chills or night sweats Eyes: Denies blurriness of vision Ears, nose, mouth, throat, and face: Denies mucositis or sore throat Respiratory: Denies cough, dyspnea or wheezes Cardiovascular: Denies palpitation, chest discomfort or lower extremity swelling Gastrointestinal:  Denies nausea, heartburn or change in bowel habits Skin: Denies abnormal skin rashes Lymphatics: Denies new lymphadenopathy or easy bruising Neurological:Denies numbness, tingling or new weaknesses Behavioral/Psych: Mood is stable, no new changes  All other systems were reviewed with the patient and are negative.  PHYSICAL EXAMINATION:   Vitals:   04/06/23 1020  BP: (!) 131/92  Pulse: 71  Resp: 18  Temp: (!) 97.1 F (36.2 C)  SpO2: 96%    GENERAL:alert, no distress and comfortable SKIN: skin color, texture, turgor are normal, no rashes or significant lesions LYMPH:  no palpable lymphadenopathy in the cervical, axillary or inguinal LUNGS: clear to auscultation and percussion with normal breathing effort HEART: regular rate &  rhythm and no murmurs and no lower extremity edema ABDOMEN:abdomen soft, non-tender and normal bowel sounds Musculoskeletal:no cyanosis of digits and no clubbing  NEURO: alert & oriented x 3 with fluent speech  LABORATORY DATA:  Labs from primary care on 03/24/2023: CBC: WBC: 13.2, hemoglobin: 17.3, hemoglobin crit: 51.8, platelets: 322   Latest Reference Range & Units 06/20/10 22:36 06/21/10 06:30 06/22/10 03:38  06/23/10 05:35 07/26/14 09:30 06/01/17 15:58 07/05/17 13:01 07/06/17 04:53 07/11/17 16:28 04/17/20 11:15 04/06/23 11:01  WBC 4.0 - 10.5 K/uL 19.5 (H) 17.9 (H) 17.4 (H) 13.1 (H) 15.1 (H) 14.5 (H) 12.4 (H) 13.9 (H) 15.3 (H) 13.2 (H) 12.1 (H)  (H): Data is abnormally high  Lab Results  Component Value Date   WBC 12.1 (H) 04/06/2023   NEUTROABS 8.1 (H) 04/06/2023   HGB 17.3 (H) 04/06/2023   HCT 52.1 (H) 04/06/2023   MCV 92.2 04/06/2023   PLT 318 04/06/2023      Component Value Date/Time   NA 140 04/06/2023 1101   NA 140 04/17/2020 1115   K 4.0 04/06/2023 1101   CL 106 04/06/2023 1101   CO2 26 04/06/2023 1101   GLUCOSE 121 (H) 04/06/2023 1101   BUN 18 04/06/2023 1101   BUN 16 04/17/2020 1115   CREATININE 0.79 04/06/2023 1101   CALCIUM 8.9 04/06/2023 1101   PROT 7.5 04/06/2023 1101   PROT 6.6 08/24/2017 1141   ALBUMIN 4.0 04/06/2023 1101   ALBUMIN 4.2 08/24/2017 1141   AST 19 04/06/2023 1101   ALT 31 04/06/2023 1101   ALKPHOS 86 04/06/2023 1101   BILITOT 0.7 04/06/2023 1101   BILITOT 0.5 08/24/2017 1141   GFRNONAA >60 04/06/2023 1101   GFRAA 88 04/17/2020 1115      Chemistry      Component Value Date/Time   NA 140 04/06/2023 1101   NA 140 04/17/2020 1115   K 4.0 04/06/2023 1101   CL 106 04/06/2023 1101   CO2 26 04/06/2023 1101   BUN 18 04/06/2023 1101   BUN 16 04/17/2020 1115   CREATININE 0.79 04/06/2023 1101      Component Value Date/Time   CALCIUM 8.9 04/06/2023 1101   ALKPHOS 86 04/06/2023 1101   AST 19 04/06/2023 1101   ALT 31 04/06/2023 1101   BILITOT 0.7 04/06/2023 1101   BILITOT 0.5 08/24/2017 1141      ASSESSMENT & PLAN:  Patient is a 68 year old male with past medical history of COPD and A-fib presenting for chronic leukocytosis   Leukocytosis Chronic elevation since at least 2012, with recent count at 13.8 (normal upper limit 11). No associated symptoms of night sweats, weight loss, fevers, or frequent infections. No current use of steroids or other  medications known to elevate WBC count. History of smoking and COPD. No family history of blood cancers. -Order flow cytometry to rule out leukemia or other myeloproliferative disorders -Will obtain a BCR-ABL -Will obtain ESR and CRP -Follow-up in 2 weeks to discuss results.   Orders Placed This Encounter  Procedures   CBC with Differential/Platelet    Standing Status:   Future    Number of Occurrences:   1    Expected Date:   04/06/2023    Expiration Date:   04/05/2024   Comprehensive metabolic panel    Standing Status:   Future    Number of Occurrences:   1    Expected Date:   04/06/2023    Expiration Date:   04/05/2024   Lactate dehydrogenase    Standing Status:  Future    Number of Occurrences:   1    Expected Date:   04/06/2023    Expiration Date:   04/05/2024   Uric acid    Standing Status:   Future    Number of Occurrences:   1    Expected Date:   04/06/2023    Expiration Date:   04/05/2024   Flow Cytometry, Peripheral Blood (Oncology)    Standing Status:   Future    Number of Occurrences:   1    Expected Date:   04/06/2023    Expiration Date:   04/05/2024   C-reactive protein    Standing Status:   Future    Expected Date:   04/06/2023    Expiration Date:   04/05/2024   Sedimentation rate    Standing Status:   Future    Expected Date:   04/06/2023    Expiration Date:   04/05/2024    The total time spent in the appointment was 40 minutes encounter with patients including review of chart and various tests results, discussions about plan of care and coordination of care plan   All questions were answered. The patient knows to call the clinic with any problems, questions or concerns. No barriers to learning was detected.   Cindie Crumbly, MD 1/22/20254:22 PM

## 2023-04-06 NOTE — Assessment & Plan Note (Signed)
Chronic elevation since at least 2012, with recent count at 13.8 (normal upper limit 11). No associated symptoms of night sweats, weight loss, fevers, or frequent infections. No current use of steroids or other medications known to elevate WBC count. History of smoking and COPD. No family history of blood cancers. -Order flow cytometry to rule out leukemia or other myeloproliferative disorders -Will obtain a BCR-ABL -Will obtain ESR and CRP -Follow-up in 2 weeks to discuss results.

## 2023-04-12 LAB — SURGICAL PATHOLOGY

## 2023-04-13 LAB — BCR-ABL1, CML/ALL, PCR, QUANT
E1A2 Transcript: <0.0032 %
Interpretation (BCRAL):: NEGATIVE
b2a2 transcript: <0.0032 %
b3a2 transcript: <0.0032 %

## 2023-04-14 ENCOUNTER — Encounter: Payer: Self-pay | Admitting: Physician Assistant

## 2023-04-14 ENCOUNTER — Telehealth: Payer: Self-pay | Admitting: Cardiology

## 2023-04-14 MED ORDER — RIVAROXABAN 15 MG PO TABS: 15.0000 mg | ORAL_TABLET | Freq: Every day | ORAL | 0 refills | Status: DC

## 2023-04-14 MED ORDER — RIVAROXABAN 2.5 MG PO TABS: 2.5000 mg | ORAL_TABLET | Freq: Every day | ORAL | 0 refills | Status: DC

## 2023-04-14 NOTE — Addendum Note (Signed)
Addended by: Sharen Hones on: 04/14/2023 04:43 PM   Modules accepted: Orders

## 2023-04-14 NOTE — Telephone Encounter (Signed)
He can switch over now but he will need an appt with me next week.  He will need enough Xarelto to overlap with warfarin x 3 days.  Does [facility] has 1 wk of samples he could pick up there to get him through till I see him?

## 2023-04-14 NOTE — Telephone Encounter (Signed)
Patient calling the office for samples of medication:   1.  What medication and dosage are you requesting samples for? rivaroxaban (XARELTO) 20 MG TABS tablet  2.  Are you currently out of this medication? Yes

## 2023-04-14 NOTE — Telephone Encounter (Signed)
Pt will be scheduled with me next week to transition from Xarelto to warfarin.

## 2023-04-14 NOTE — Telephone Encounter (Signed)
Spoke with patient and wife again and they stated [facility] has 1 week worth 20 mg xarelto and the patient will pick this up tomorrow. We will give patient 1 week of 15 mg and 1 week of 5 Mg (2.5 Mg 14 tabs)  equaling 20 mg daily  (cleared this with lisa)  [facility] for pickup on 04/15/23  Patient now states he would like to try to make it to he gets paid agaian so he can pay the deductible so he does not have to switch to warfarin. Patient will advise [place] of their decision after talking with insurance company and pharmacy

## 2023-04-14 NOTE — Telephone Encounter (Signed)
Patient wife stated if they get it filled now it is 302$  I advised patient we do not have any samples and that the new [facility] 2025 is supposed to be after patient first paid the deductible Medicine should go down to $47 a month  Patient wife states he has 5 days left of xarelto left and Dr.McDowell was aware of their plans of wanting to switch to coumadin for patient and they are curious on if they can do this now.  Sent to [employer] for advise and dr.mcdowell

## 2023-04-20 ENCOUNTER — Inpatient Hospital Stay: Payer: Medicare Other

## 2023-04-20 ENCOUNTER — Inpatient Hospital Stay: Payer: Medicare Other | Attending: Oncology | Admitting: Oncology

## 2023-04-20 VITALS — BP 125/88 | HR 76 | Temp 98.1°F | Resp 20 | Wt 237.2 lb

## 2023-04-20 DIAGNOSIS — D72825 Bandemia: Secondary | ICD-10-CM

## 2023-04-20 DIAGNOSIS — F17200 Nicotine dependence, unspecified, uncomplicated: Secondary | ICD-10-CM | POA: Diagnosis not present

## 2023-04-20 DIAGNOSIS — F17210 Nicotine dependence, cigarettes, uncomplicated: Secondary | ICD-10-CM | POA: Insufficient documentation

## 2023-04-20 DIAGNOSIS — Z79899 Other long term (current) drug therapy: Secondary | ICD-10-CM | POA: Insufficient documentation

## 2023-04-20 DIAGNOSIS — Z7901 Long term (current) use of anticoagulants: Secondary | ICD-10-CM | POA: Insufficient documentation

## 2023-04-20 DIAGNOSIS — D72829 Elevated white blood cell count, unspecified: Secondary | ICD-10-CM | POA: Insufficient documentation

## 2023-04-20 LAB — C-REACTIVE PROTEIN: CRP: 0.7 mg/dL (ref <1.0)

## 2023-04-20 LAB — SEDIMENTATION RATE: Sed Rate: 7 mm/h (ref 0–16)

## 2023-04-20 NOTE — Assessment & Plan Note (Signed)
 Patient has persistent slightly improved leukocytosis.  Flow cytometry negative.  BCR-ABL negative.  Normal LDH. -Will order inflammatory markers and rheumatology workup as patient reports some joint pains -If everything comes back negative, leukocytosis is likely secondary to smoking. -Recommended patient to quit smoking  Return to clinic in 3 months

## 2023-04-20 NOTE — Progress Notes (Signed)
 [facility] HEMATOLOGY FOLLOW-UP VISIT  Jolee, Amy H, PA-C  REASON FOR FOLLOW-UP: Leukocytosis  ASSESSMENT & PLAN:  Patient is a 68 year old male with COPD following up for leukocytosis   Leukocytosis Patient has persistent slightly improved leukocytosis.  Flow cytometry negative.  BCR-ABL negative.  Normal LDH. -Will order inflammatory markers and rheumatology workup as patient reports some joint pains -If everything comes back negative, leukocytosis is likely secondary to smoking. -Recommended patient to quit smoking  Return to clinic in 3 months  Tobacco use disorder Both patient and spouse are heavy smokers. -Strongly advised both to quit smoking. -Denied gum/patches -Plan to reassess smoking status in 3 months.   Orders Placed This Encounter  Procedures   Cyclic Citrul Peptide Antibody, IGG/IGA    Standing Status:   Future    Number of Occurrences:   1    Expected Date:   04/20/2023    Expiration Date:   04/19/2024   ANA, IFA (with reflex)    Standing Status:   Future    Number of Occurrences:   1    Expected Date:   04/20/2023    Expiration Date:   04/19/2024   Rheumatoid factor    Standing Status:   Future    Number of Occurrences:   1    Expected Date:   04/20/2023    Expiration Date:   04/19/2024   Sedimentation rate    Standing Status:   Future    Number of Occurrences:   1    Expected Date:   04/20/2023    Expiration Date:   04/19/2024   C-reactive protein    Standing Status:   Future    Number of Occurrences:   1    Expected Date:   04/20/2023    Expiration Date:   04/19/2024   CBC with Differential/Platelet    Standing Status:   Future    Expected Date:   07/18/2023    Expiration Date:   04/19/2024    The total time spent in the appointment was 15 minutes encounter with patients including review of chart and various tests results, discussions about plan of care and coordination of care plan   All questions were answered. The  patient knows to call the clinic with any problems, questions or concerns. No barriers to learning was detected.  Mickiel Dry, MD 2/5/20253:33 PM   INTERVAL HISTORY: [name] 68 y.o. male is following for leukocytosis.  He is accompanied by his wife today.He reports experiencing joint pains in his hands, shoulder, and knees, which he manages with over-the-counter Advil. 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   We discussed that labs are less concerning for leukemia/lymphoma.  Will do workup for rheumatologic causes.    I have reviewed the past medical history, past surgical history, social history and family history with the patient   ALLERGIES:  has no known allergies.  MEDICATIONS:  Current Outpatient Medications  Medication Sig Dispense Refill   acetaminophen  (TYLENOL ) 500 MG tablet Take 1,000 mg by mouth every 6 (six) hours as needed for moderate pain.     atorvastatin  (LIPITOR) 80 MG tablet Take 1 tablet (80 mg total) by mouth daily. 90 tablet 1   cetirizine (ZYRTEC) 10 MG tablet Take 10 mg by mouth daily.     furosemide  (LASIX ) 20 MG  tablet TAKE 1 TABLET(20 MG) BY MOUTH DAILY 90 tablet 1   guaiFENesin  (MUCINEX ) 600 MG 12 hr tablet Take 600 mg by mouth 2 (two) times daily.      ibuprofen (ADVIL) 200 MG tablet Take 400 mg by mouth every 6 (six) hours as needed for moderate pain.     ipratropium-albuterol  (DUONEB) 0.5-2.5 (3) MG/3ML SOLN SMARTSIG:3 Milliliter(s) Via Nebulizer 4 Times Daily PRN     metoprolol  tartrate (LOPRESSOR ) 25 MG tablet TAKE 1 TABLET(25 MG) BY MOUTH TWICE DAILY 180 tablet 1   Multiple Vitamin (MULTIVITAMIN) tablet Take 1 tablet by mouth daily.     nitroGLYCERIN  (NITROSTAT ) 0.4 MG SL tablet Place 1 tablet (0.4 mg total) under the tongue every 5 (five) minutes x 3 doses as needed for chest pain (if no relief after 2nd dose, proceed  to ED for evaluation or call 911). 25 tablet 3   Rivaroxaban  (XARELTO ) 15 MG TABS tablet Take 1 tablet (15 mg total) by mouth daily. 7 tablet 0   rivaroxaban  (XARELTO ) 2.5 MG TABS tablet Take 1 tablet (2.5 mg total) by mouth daily. 14 tablet 0   rivaroxaban  (XARELTO ) 20 MG TABS tablet Take 1 tablet (20 mg total) by mouth daily with supper. 28 tablet 0   No current facility-administered medications for this visit.     REVIEW OF SYSTEMS:   Constitutional: Denies fevers, chills or night sweats Eyes: Denies blurriness of vision Ears, nose, mouth, throat, and face: Denies mucositis or sore throat Respiratory: Denies cough, dyspnea or wheezes Cardiovascular: Denies palpitation, chest discomfort or lower extremity swelling Gastrointestinal:  Denies nausea, heartburn or change in bowel habits Skin: Denies abnormal skin rashes Lymphatics: Denies new lymphadenopathy or easy bruising Neurological:Denies numbness, tingling or new weaknesses Behavioral/Psych: Mood is stable, no new changes  All other systems were reviewed with the patient and are negative.  PHYSICAL EXAMINATION:   Vitals:   04/20/23 1415  BP: 125/88  Pulse: 76  Resp: 20  Temp: 98.1 F (36.7 C)  SpO2: 95%    GENERAL:alert, no distress and comfortable LYMPH:  no palpable lymphadenopathy in the cervical, axillary or inguinal LUNGS: clear to auscultation and percussion with normal breathing effort HEART: regular rate & rhythm and no murmurs and no lower extremity edema ABDOMEN:abdomen soft, non-tender and normal bowel sounds Musculoskeletal:no cyanosis of digits and no clubbing  NEURO: alert & oriented x 3 with fluent speech  LABORATORY DATA:  I have reviewed the data as listed  Lab Results  Component Value Date   WBC 12.1 (H) 04/06/2023   NEUTROABS 8.1 (H) 04/06/2023   HGB 17.3 (H) 04/06/2023   HCT 52.1 (H) 04/06/2023   MCV 92.2 04/06/2023   PLT 318 04/06/2023       Chemistry      Component Value Date/Time    NA 140 04/06/2023 1101   NA 140 04/17/2020 1115   K 4.0 04/06/2023 1101   CL 106 04/06/2023 1101   CO2 26 04/06/2023 1101   BUN 18 04/06/2023 1101   BUN 16 04/17/2020 1115   CREATININE 0.79 04/06/2023 1101      Component Value Date/Time   CALCIUM  8.9 04/06/2023 1101   ALKPHOS 86 04/06/2023 1101   AST 19 04/06/2023 1101   ALT 31 04/06/2023 1101   BILITOT 0.7 04/06/2023 1101   BILITOT 0.5 08/24/2017 1141      Latest Reference Range & Units 04/06/23 11:08  b2a2 transcript % <0.0032 %  b3a2 transcript % <0.0032 %  E1A2 Transcript % <  0.0032 %  Interpretation (BCRAL):  Negative  Director Review [facility]):  Comment  BCR-ABL1, CML/ALL, PCR, QUANT  Rpt (C)  (C): Corrected Rpt: View report in Results Review for more information   Latest Reference Range & Units 04/06/23 11:01  LDH 98 - 192 U/L 124   Flow cytometry: DIAGNOSIS:   Peripheral blood, flow cytometry:  -  No immunophenotypic evidence of lymphoproliferative sorter (i.e. no monoclonal B cells or immunophenotypically abnormal T cells detected).

## 2023-04-20 NOTE — Patient Instructions (Signed)
 VISIT SUMMARY:  You came in today for a follow-up visit to discuss your high white blood cell count and COPD. You also mentioned experiencing joint pains in your hands, shoulders, and knees. We reviewed your recent lab results, which showed a slightly high glucose level and a white blood cell count of 12.1. Tests for leukemia and lymphoma were negative.  YOUR PLAN:  -ELEVATED WHITE BLOOD CELL COUNT: Your white blood cell count remains high, which could be due to an inflammatory process given your joint pains. We will order a rheumatology workup to investigate further and review the results in 3 months.  -JOINT PAIN: You are experiencing pain in your hands, shoulders, and knees, which you are managing with Advil. We will consider referring you to a rheumatologist based on the results of the upcoming workup.  -COPD: Chronic Obstructive Pulmonary Disease (COPD) is a lung condition that makes it hard to breathe, often caused by long-term exposure to irritating gases or particulate matter, most often from cigarette smoke. It is very important that you quit smoking to help manage this condition.  -TOBACCO USE: Both you and your spouse are heavy smokers. Smoking is harmful to your health and can worsen your COPD. It is strongly advised that both of you quit smoking.   INSTRUCTIONS:  Please follow up in 3 months to review the results of your rheumatology workup and reassess your smoking status.

## 2023-04-20 NOTE — Assessment & Plan Note (Signed)
 Both patient and spouse are heavy smokers. -Strongly advised both to quit smoking. -Denied gum/patches -Plan to reassess smoking status in 3 months.

## 2023-04-21 LAB — CYCLIC CITRUL PEPTIDE ANTIBODY, IGG/IGA: CCP Antibodies IgG/IgA: >250 U — ABNORMAL HIGH (ref 0–19)

## 2023-04-21 LAB — RHEUMATOID FACTOR: Rheumatoid fact SerPl-aCnc: 387.9 [IU]/mL — ABNORMAL HIGH (ref <14.0)

## 2023-04-26 LAB — ANTINUCLEAR ANTIBODIES, IFA: ANA Ab, IFA: NEGATIVE

## 2023-05-09 ENCOUNTER — Other Ambulatory Visit: Payer: Self-pay | Admitting: Cardiology

## 2023-07-07 ENCOUNTER — Other Ambulatory Visit: Payer: Self-pay | Admitting: Cardiology

## 2023-07-07 NOTE — Telephone Encounter (Signed)
 Prescription refill request for Xarelto  received.  Indication: afib  Last office visit:Mcdowell, 03/21/2023 Weight: 107.6 kg  Age: 68 yo  Scr: 0.79, 04/06/2023 CrCl: 134 ml/min   Refill sent.

## 2023-07-14 ENCOUNTER — Inpatient Hospital Stay: Payer: Medicare Other

## 2023-07-21 ENCOUNTER — Inpatient Hospital Stay: Payer: Medicare Other | Admitting: Oncology

## 2023-09-19 DIAGNOSIS — I10 Essential (primary) hypertension: Secondary | ICD-10-CM | POA: Diagnosis not present

## 2023-09-19 DIAGNOSIS — R7303 Prediabetes: Secondary | ICD-10-CM | POA: Diagnosis not present

## 2023-09-19 DIAGNOSIS — Z0001 Encounter for general adult medical examination with abnormal findings: Secondary | ICD-10-CM | POA: Diagnosis not present

## 2023-09-19 LAB — LAB REPORT - SCANNED
A1c: 6
EGFR: 87.4

## 2023-09-29 DIAGNOSIS — J449 Chronic obstructive pulmonary disease, unspecified: Secondary | ICD-10-CM | POA: Diagnosis not present

## 2023-09-29 DIAGNOSIS — R7303 Prediabetes: Secondary | ICD-10-CM | POA: Diagnosis not present

## 2023-09-29 DIAGNOSIS — I480 Paroxysmal atrial fibrillation: Secondary | ICD-10-CM | POA: Diagnosis not present

## 2023-09-29 DIAGNOSIS — I10 Essential (primary) hypertension: Secondary | ICD-10-CM | POA: Diagnosis not present

## 2023-10-28 ENCOUNTER — Other Ambulatory Visit (HOSPITAL_COMMUNITY): Payer: Self-pay

## 2023-10-28 ENCOUNTER — Other Ambulatory Visit: Payer: Self-pay | Admitting: Cardiology

## 2023-10-28 MED ORDER — NITROGLYCERIN 0.4 MG SL SUBL
0.4000 mg | SUBLINGUAL_TABLET | SUBLINGUAL | 3 refills | Status: DC | PRN
  Filled 2023-10-28: qty 25, 10d supply, fill #0

## 2023-10-31 ENCOUNTER — Other Ambulatory Visit: Payer: Self-pay | Admitting: Cardiology

## 2023-11-03 ENCOUNTER — Other Ambulatory Visit (HOSPITAL_COMMUNITY): Payer: Self-pay

## 2023-11-03 MED ORDER — NITROGLYCERIN 0.4 MG SL SUBL
0.4000 mg | SUBLINGUAL_TABLET | SUBLINGUAL | 3 refills | Status: AC | PRN
Start: 2023-11-03 — End: ?

## 2023-11-08 ENCOUNTER — Other Ambulatory Visit (HOSPITAL_COMMUNITY): Payer: Self-pay

## 2023-11-29 ENCOUNTER — Ambulatory Visit: Attending: Cardiology | Admitting: Cardiology

## 2023-11-29 ENCOUNTER — Encounter: Payer: Self-pay | Admitting: Cardiology

## 2023-11-29 VITALS — BP 136/84 | HR 74 | Ht 70.0 in | Wt 240.6 lb

## 2023-11-29 DIAGNOSIS — E782 Mixed hyperlipidemia: Secondary | ICD-10-CM | POA: Diagnosis not present

## 2023-11-29 DIAGNOSIS — I25119 Atherosclerotic heart disease of native coronary artery with unspecified angina pectoris: Secondary | ICD-10-CM | POA: Diagnosis not present

## 2023-11-29 DIAGNOSIS — I10 Essential (primary) hypertension: Secondary | ICD-10-CM

## 2023-11-29 DIAGNOSIS — I4819 Other persistent atrial fibrillation: Secondary | ICD-10-CM | POA: Diagnosis not present

## 2023-11-29 NOTE — Patient Instructions (Addendum)

## 2023-11-29 NOTE — Progress Notes (Signed)
    Cardiology Office Note  Date: 11/29/2023   ID: Cavan, Bearden 30-Nov-1955, MRN [id number]  History of Present Illness: [name] is a 68 y.o. male last seen in January.  He is here with his wife for a follow-up visit.  Reports no interval angina or nitroglycerin  use, recently got a refill.  Remains active with ADLs, although does have to pace himself.  No palpitations other than a brief feeling when he first lays down at nighttime.  No dizziness or syncope.  Medications reviewed.  He does not report any spontaneous bleeding problems on Xarelto .  I reviewed his interval lab work, LDL 36 in July on Lipitor.  No increasing leg edema on Lasix .  Physical Exam: VS:  BP 136/84   Pulse 74   Ht 5' 10 (1.778 m)   Wt 240 lb 9.6 oz (109.1 kg)   SpO2 93%   BMI 34.52 kg/m , BMI Body mass index is 34.52 kg/m.  Wt Readings from Last 3 Encounters:  11/29/23 240 lb 9.6 oz (109.1 kg)  04/20/23 237 lb 3.4 oz (107.6 kg)  04/06/23 239 lb 12.8 oz (108.8 kg)    General: Patient appears comfortable at rest. HEENT: Conjunctiva and lids normal. Neck: Supple, no elevated JVP or carotid bruits. Lungs: Clear to auscultation, nonlabored breathing at rest. Cardiac: Regular rate and rhythm, no S3 or significant systolic murmur. Extremities: No pitting edema.  ECG:  An ECG dated 03/21/2023 was personally reviewed today and demonstrated:  Sinus rhythm with PVCs, old inferior infarct pattern.  Labwork: January 2024: Cholesterol 105, triglycerides 117, HDL 33, LDL 51 04/06/2023: ALT 31; AST 19; BUN 18; Creatinine, Ser 0.79; Hemoglobin 17.3; Platelets 318; Potassium 4.0; Sodium 140  July 2025: Hemoglobin 16.9, platelets 330, potassium 4.1, BUN 13, creatinine 0.94, GFR 87, AST 11, ALT 21, hemoglobin A1c 6%, cholesterol 87, triglycerides 88, HDL 33, LDL 36  Other Studies Reviewed Today:  No interval cardiac testing for review today.  Assessment and Plan:  1.  Paroxysmal to persistent atrial  fibrillation with CHA2DS2-VASc score of 3 status post atrial fibrillation ablation in 2022.  He continues to do well with no obvious persisting arrhythmia.  Continues on Xarelto  20 mg daily for stroke prophylaxis.  Also on Lopressor  25 mg twice daily.   2.  CAD status post DES to the RCA in 2006 and DES to the circumflex in 2012.  Follow-up angiography in 2012 showed CTO of the mid RCA managed medically.  He does not report any angina, recently refilled fresh bottle of nitroglycerin .  Continue Lipitor 80 mg daily.   3.  Mixed hyperlipidemia.  LDL 36 in July.  Continue Lipitor 80 mg daily.   4.  Primary hypertension.  No changes to current regimen.  Disposition:  Follow up 6 months.  Signed, Jayson JUDITHANN Sierras, M.D., F.A.C.C. [facility]

## 2024-01-03 ENCOUNTER — Other Ambulatory Visit: Payer: Self-pay | Admitting: Cardiology

## 2024-01-03 DIAGNOSIS — I480 Paroxysmal atrial fibrillation: Secondary | ICD-10-CM

## 2024-01-03 NOTE — Telephone Encounter (Signed)
 Xarelto  20mg  refill request received. Pt is 68 years old, weight-109.1kg, Crea-0.79 on 04/06/23, last seen by Dr. Debera on 11/29/23, Diagnosis-Afib, CrCl-136.06 mL/min; Dose is appropriate based on dosing criteria. Will send in refill to requested pharmacy.

## 2024-02-01 ENCOUNTER — Other Ambulatory Visit: Payer: Self-pay | Admitting: Cardiology

## 2024-04-17 ENCOUNTER — Encounter: Payer: Self-pay | Admitting: *Deleted

## 2024-05-29 ENCOUNTER — Ambulatory Visit: Admitting: Cardiology
